# Patient Record
Sex: Female | Born: 2000 | Hispanic: Yes | Marital: Married | Smoking: Never smoker
Health system: Southern US, Community
[De-identification: ages and names within clinical notes are randomized; demographics above are authoritative.]

## PROBLEM LIST (undated history)

## (undated) DIAGNOSIS — Z789 Other specified health status: Secondary | ICD-10-CM

## (undated) HISTORY — PX: NO PAST SURGERIES: SHX2092

## (undated) HISTORY — DX: Other specified health status: Z78.9

---

## 2019-09-09 ENCOUNTER — Ambulatory Visit: Payer: 59 | Attending: Internal Medicine

## 2019-09-09 DIAGNOSIS — Z23 Encounter for immunization: Secondary | ICD-10-CM

## 2019-09-09 NOTE — Progress Notes (Signed)
   Covid-19 Vaccination Clinic  Name:  Meagan Martin    MRN: [id number] DOB: 01/15/2001  09/09/2019  Ms. Meagan Martin was observed post Covid-19 immunization for 15 minutes without incident. She was provided with Vaccine Information Sheet and instruction to access the V-Safe system.   Ms. Meagan Martin was instructed to call 911 with any severe reactions post vaccine: Marland Kitchen Difficulty breathing  . Swelling of face and throat  . A fast heartbeat  . A bad rash all over body  . Dizziness and weakness   Immunizations Administered    Name Date Dose VIS Date Route   Pfizer COVID-19 Vaccine 09/09/2019 12:29 PM 0.3 mL 06/09/2019 Intramuscular   Manufacturer: [employer], [employer]   Lot: [id number]   NDC: [address]-2

## 2019-10-03 ENCOUNTER — Ambulatory Visit: Payer: 59 | Attending: Internal Medicine

## 2019-10-03 DIAGNOSIS — Z23 Encounter for immunization: Secondary | ICD-10-CM

## 2019-10-03 NOTE — Progress Notes (Signed)
   Covid-19 Vaccination Clinic  Name:  Meagan Martin    MRN: [id number] DOB: [birth year]  10/03/2019  Ms. Meagan Martin was observed post Covid-19 immunization for 15 minutes without incident. She was provided with Vaccine Information Sheet and instruction to access the V-Safe system.   Ms. Meagan Martin was instructed to call 911 with any severe reactions post vaccine: Marland Kitchen Difficulty breathing  . Swelling of face and throat  . A fast heartbeat  . A bad rash all over body  . Dizziness and weakness   Immunizations Administered    Name Date Dose VIS Date Route   Pfizer COVID-19 Vaccine 10/03/2019 11:34 AM 0.3 mL 06/09/2019 Intramuscular   Manufacturer: [employer], [employer]   Lot: [id number]   NDC: [address]-0

## 2020-02-27 ENCOUNTER — Other Ambulatory Visit: Payer: Self-pay

## 2020-02-27 ENCOUNTER — Ambulatory Visit (INDEPENDENT_AMBULATORY_CARE_PROVIDER_SITE_OTHER): Payer: 59 | Admitting: Advanced Practice Midwife

## 2020-02-27 ENCOUNTER — Other Ambulatory Visit (HOSPITAL_COMMUNITY)
Admission: RE | Admit: 2020-02-27 | Discharge: 2020-02-27 | Disposition: A | Discharge status: Home / Self Care | Payer: 59 | Source: Ambulatory Visit | Attending: Advanced Practice Midwife | Admitting: Advanced Practice Midwife

## 2020-02-27 ENCOUNTER — Encounter: Payer: Self-pay | Admitting: Advanced Practice Midwife

## 2020-02-27 VITALS — BP 122/84 | HR 82 | Ht 61.0 in | Wt 137.0 lb

## 2020-02-27 DIAGNOSIS — Z3401 Encounter for supervision of normal first pregnancy, first trimester: Secondary | ICD-10-CM

## 2020-02-27 DIAGNOSIS — Z3A08 8 weeks gestation of pregnancy: Secondary | ICD-10-CM

## 2020-02-27 DIAGNOSIS — Z23 Encounter for immunization: Secondary | ICD-10-CM

## 2020-02-27 NOTE — Progress Notes (Signed)
DATING AND VIABILITY SONOGRAM   Meagan Martin is a 19 y.o. year old G1P0 with LMP Patient's last menstrual period was 12/25/2019. which would correlate to  [redacted]w[redacted]d weeks gestation.  She has regular menstrual cycles.   She is here today for a confirmatory initial sonogram.    GESTATION: SINGLETON     FETAL ACTIVITY:          Heart rate       167 bpm          The fetus is active.   GESTATIONAL AGE AND  BIOMETRICS:  Gestational criteria: Estimated Date of Delivery: 10/03/20 by LMP now at [redacted]w[redacted]d  Previous Scans:1      CROWN RUMP LENGTH          1.83 cm       8-2weeks                                                                               AVERAGE EGA(BY THIS SCAN):  8-2 weeks  WORKING EDD( early ultrasound ):  10-03-2020     TECHNICIAN COMMENTS: Patient informed that the ultrasound is considered a limited obstetric ultrasound and is not intended to be a complete ultrasound exam. Patient also informed that the ultrasound is not being completed with the intent of assessing for fetal or placental anomalies or any pelvic abnormalities. Explained that the purpose of today's ultrasound is to assess for fetal heart rate. Patient acknowledges the purpose of the exam and the limitations of the study.      Armandina Stammer 02/27/2020 8:58 AM

## 2020-02-27 NOTE — Progress Notes (Signed)
  Subjective:    Meagan Martin is a G1P0 [redacted]w[redacted]d being seen today for her first obstetrical visit.  Her obstetrical history is significant for Primigravida. Patient does intend to breast feed. Pregnancy history fully reviewed.  Patient reports fatigue.  No nausea.  Vitals:   02/27/20 0837 02/27/20 0838  BP: 122/84   Pulse: 82   Weight: 137 lb (62.1 kg)   Height:  5\' 1"  (1.549 m)    HISTORY: OB History  Gravida Para Term Preterm AB Living  1            SAB TAB Ectopic Multiple Live Births               # Outcome Date GA Lbr Len/2nd Weight Sex Delivery Anes PTL Lv  1 Current            Past Medical History:  Diagnosis Date  . Medical history non-contributory    Past Surgical History:  Procedure Laterality Date  . NO PAST SURGERIES     Family History  Problem Relation Age of Onset  . Diabetes Mother   . Diabetes Father   . Cancer Maternal Grandmother        throat  . Intellectual disability Neg Hx   . Hypertension Neg Hx   . Stroke Neg Hx      Exam    Uterus:     Pelvic Exam:    Perineum: Normal Perineum   Vulva: Bartholin's, Urethra, Skene's normal   Vagina:  normal discharge   pH:    Cervix: no cervical motion tenderness   Adnexa: normal adnexa and no mass, fullness, tenderness   Bony Pelvis: gynecoid  System: Breast:  normal appearance, no masses or tenderness   Skin: normal coloration and turgor, no rashes    Neurologic: oriented, grossly non-focal   Extremities: normal strength, tone, and muscle mass   HEENT neck supple with midline trachea   Mouth/Teeth mucous membranes moist, pharynx normal without lesions   Neck supple and no masses   Cardiovascular: regular rate and rhythm   Respiratory:  appears well, vitals normal, no respiratory distress, acyanotic, normal RR, ear and throat exam is normal, neck free of mass or lymphadenopathy, chest clear, no wheezing, crepitations, rhonchi, normal symmetric air entry   Abdomen: soft, non-tender; bowel  sounds normal; no masses,  no organomegaly   Urinary: urethral meatus normal      Assessment:    Pregnancy: G1P0 Primigravida  Did get Covid Vaccine 10/03/19    Plan:  Initial labs drawn. Continue prenatal vitamins. Discussed and offered genetic screening options, including Quad screen/AFP, NIPS testing, and option to decline testing. Benefits/risks/alternatives reviewed. Pt aware that anatomy 12/03/19 is form of genetic screening with lower accuracy in detecting trisomies than blood work.  Pt chooses genetic screening today. NIPS: ordered. Ultrasound discussed; fetal anatomic survey: ordered. Problem list reviewed and updated. The nature of [facility] with multiple MDs and other Advanced Practice Providers was explained to patient; also emphasized that residents, students are part of our team. Routine obstetric precautions reviewed.   Follow up in 4 weeks. 50% of 30 min visit spent on counseling and coordination of care.   Plans to breastfeed, declines circumcision.  Discussed option for waterbirth.    [facility] 02/27/2020

## 2020-02-27 NOTE — Patient Instructions (Signed)
First Trimester of Pregnancy The first trimester of pregnancy is from week 1 until the end of week 13 (months 1 through 3). A week after a sperm fertilizes an egg, the egg will implant on the wall of the uterus. This embryo will begin to develop into a baby. Genes from you and your partner will form the baby. The female genes will determine whether the baby will be a boy or a girl. At 6-8 weeks, the eyes and face will be formed, and the heartbeat can be seen on ultrasound. At the end of 12 weeks, all the baby's organs will be formed. Now that you are pregnant, you will want to do everything you can to have a healthy baby. Two of the most important things are to get good prenatal care and to follow your health care provider's instructions. Prenatal care is all the medical care you receive before the baby's birth. This care will help prevent, find, and treat any problems during the pregnancy and childbirth. Body changes during your first trimester Your body goes through many changes during pregnancy. The changes vary from woman to woman.  You may gain or lose a couple of pounds at first.  You may feel sick to your stomach (nauseous) and you may throw up (vomit). If the vomiting is uncontrollable, call your health care provider.  You may tire easily.  You may develop headaches that can be relieved by medicines. All medicines should be approved by your health care provider.  You may urinate more often. Painful urination may mean you have a bladder infection.  You may develop heartburn as a result of your pregnancy.  You may develop constipation because certain hormones are causing the muscles that push stool through your intestines to slow down.  You may develop hemorrhoids or swollen veins (varicose veins).  Your breasts may begin to grow larger and become tender. Your nipples may stick out more, and the tissue that surrounds them (areola) may become darker.  Your gums may bleed and may be  sensitive to brushing and flossing.  Dark spots or blotches (chloasma, mask of pregnancy) may develop on your face. This will likely fade after the baby is born.  Your menstrual periods will stop.  You may have a loss of appetite.  You may develop cravings for certain kinds of food.  You may have changes in your emotions from day to day, such as being excited to be pregnant or being concerned that something may go wrong with the pregnancy and baby.  You may have more vivid and strange dreams.  You may have changes in your hair. These can include thickening of your hair, rapid growth, and changes in texture. Some women also have hair loss during or after pregnancy, or hair that feels dry or thin. Your hair will most likely return to normal after your baby is born. What to expect at prenatal visits During a routine prenatal visit:  You will be weighed to make sure you and the baby are growing normally.  Your blood pressure will be taken.  Your abdomen will be measured to track your baby's growth.  The fetal heartbeat will be listened to between weeks 10 and 14 of your pregnancy.  Test results from any previous visits will be discussed. Your health care provider may ask you:  How you are feeling.  If you are feeling the baby move.  If you have had any abnormal symptoms, such as leaking fluid, bleeding, severe headaches, or abdominal   cramping.  If you are using any tobacco products, including cigarettes, chewing tobacco, and electronic cigarettes.  If you have any questions. Other tests that may be performed during your first trimester include:  Blood tests to find your blood type and to check for the presence of any previous infections. The tests will also be used to check for low iron levels (anemia) and protein on red blood cells (Rh antibodies). Depending on your risk factors, or if you previously had diabetes during pregnancy, you may have tests to check for high blood sugar  that affects pregnant women (gestational diabetes).  Urine tests to check for infections, diabetes, or protein in the urine.  An ultrasound to confirm the proper growth and development of the baby.  Fetal screens for spinal cord problems (spina bifida) and Down syndrome.  HIV (human immunodeficiency virus) testing. Routine prenatal testing includes screening for HIV, unless you choose not to have this test.  You may need other tests to make sure you and the baby are doing well. Follow these instructions at home: Medicines  Follow your health care provider's instructions regarding medicine use. Specific medicines may be either safe or unsafe to take during pregnancy.  Take a prenatal vitamin that contains at least 600 micrograms (mcg) of folic acid.  If you develop constipation, try taking a stool softener if your health care provider approves. Eating and drinking   Eat a balanced diet that includes fresh fruits and vegetables, whole grains, good sources of protein such as meat, eggs, or tofu, and low-fat dairy. Your health care provider will help you determine the amount of weight gain that is right for you.  Avoid raw meat and uncooked cheese. These carry germs that can cause birth defects in the baby.  Eating four or five small meals rather than three large meals a day may help relieve nausea and vomiting. If you start to feel nauseous, eating a few soda crackers can be helpful. Drinking liquids between meals, instead of during meals, also seems to help ease nausea and vomiting.  Limit foods that are high in fat and processed sugars, such as fried and sweet foods.  To prevent constipation: ? Eat foods that are high in fiber, such as fresh fruits and vegetables, whole grains, and beans. ? Drink enough fluid to keep your urine clear or pale yellow. Activity  Exercise only as directed by your health care provider. Most women can continue their usual exercise routine during  pregnancy. Try to exercise for 30 minutes at least 5 days a week. Exercising will help you: ? Control your weight. ? Stay in shape. ? Be prepared for labor and delivery.  Experiencing pain or cramping in the lower abdomen or lower back is a good sign that you should stop exercising. Check with your health care provider before continuing with normal exercises.  Try to avoid standing for long periods of time. Move your legs often if you must stand in one place for a long time.  Avoid heavy lifting.  Wear low-heeled shoes and practice good posture.  You may continue to have sex unless your health care provider tells you not to. Relieving pain and discomfort  Wear a good support bra to relieve breast tenderness.  Take warm sitz baths to soothe any pain or discomfort caused by hemorrhoids. Use hemorrhoid cream if your health care provider approves.  Rest with your legs elevated if you have leg cramps or low back pain.  If you develop varicose veins in   your legs, wear support hose. Elevate your feet for 15 minutes, 3-4 times a day. Limit salt in your diet. Prenatal care  Schedule your prenatal visits by the twelfth week of pregnancy. They are usually scheduled monthly at first, then more often in the last 2 months before delivery.  Write down your questions. Take them to your prenatal visits.  Keep all your prenatal visits as told by your health care provider. This is important. Safety  Wear your seat belt at all times when driving.  Make a list of emergency phone numbers, including numbers for family, friends, the hospital, and police and fire departments. General instructions  Ask your health care provider for a referral to a local prenatal education class. Begin classes no later than the beginning of month 6 of your pregnancy.  Ask for help if you have counseling or nutritional needs during pregnancy. Your health care provider can offer advice or refer you to specialists for help  with various needs.  Do not use hot tubs, steam rooms, or saunas.  Do not douche or use tampons or scented sanitary pads.  Do not cross your legs for long periods of time.  Avoid cat litter boxes and soil used by cats. These carry germs that can cause birth defects in the baby and possibly loss of the fetus by miscarriage or stillbirth.  Avoid all smoking, herbs, alcohol, and medicines not prescribed by your health care provider. Chemicals in these products affect the formation and growth of the baby.  Do not use any products that contain nicotine or tobacco, such as cigarettes and e-cigarettes. If you need help quitting, ask your health care provider. You may receive counseling support and other resources to help you quit.  Schedule a dentist appointment. At home, brush your teeth with a soft toothbrush and be gentle when you floss. Contact a health care provider if:  You have dizziness.  You have mild pelvic cramps, pelvic pressure, or nagging pain in the abdominal area.  You have persistent nausea, vomiting, or diarrhea.  You have a bad smelling vaginal discharge.  You have pain when you urinate.  You notice increased swelling in your face, hands, legs, or ankles.  You are exposed to fifth disease or chickenpox.  You are exposed to German measles (rubella) and have never had it. Get help right away if:  You have a fever.  You are leaking fluid from your vagina.  You have spotting or bleeding from your vagina.  You have severe abdominal cramping or pain.  You have rapid weight gain or loss.  You vomit blood or material that looks like coffee grounds.  You develop a severe headache.  You have shortness of breath.  You have any kind of trauma, such as from a fall or a car accident. Summary  The first trimester of pregnancy is from week 1 until the end of week 13 (months 1 through 3).  Your body goes through many changes during pregnancy. The changes vary from  woman to woman.  You will have routine prenatal visits. During those visits, your health care provider will examine you, discuss any test results you may have, and talk with you about how you are feeling. This information is not intended to replace advice given to you by your health care provider. Make sure you discuss any questions you have with your health care provider. Document Revised: 05/28/2017 Document Reviewed: 05/27/2016 Elsevier Patient Education  2020 Else[employer] Inc.  

## 2020-02-28 LAB — CBC/D/PLT+RPR+RH+ABO+RUB AB...
Antibody Screen: NEGATIVE
Basophils Absolute: 0 10*3/uL (ref 0.0–0.2)
Basos: 0 %
EOS (ABSOLUTE): 0 10*3/uL (ref 0.0–0.4)
Eos: 0 %
HCV Ab: <0.1 s/co ratio (ref 0.0–0.9)
HIV Screen 4th Generation wRfx: NONREACTIVE
Hematocrit: 40 % (ref 34.0–46.6)
Hemoglobin: 13.3 g/dL (ref 11.1–15.9)
Hepatitis B Surface Ag: NEGATIVE
Immature Grans (Abs): 0 10*3/uL (ref 0.0–0.1)
Immature Granulocytes: 0 %
Lymphocytes Absolute: 1.8 10*3/uL (ref 0.7–3.1)
Lymphs: 27 %
MCH: 30.5 pg (ref 26.6–33.0)
MCHC: 33.3 g/dL (ref 31.5–35.7)
MCV: 92 fL (ref 79–97)
Monocytes Absolute: 0.5 10*3/uL (ref 0.1–0.9)
Monocytes: 7 %
Neutrophils Absolute: 4.3 10*3/uL (ref 1.4–7.0)
Neutrophils: 66 %
Platelets: 171 10*3/uL (ref 150–450)
RBC: 4.36 x10E6/uL (ref 3.77–5.28)
RDW: 13 % (ref 11.7–15.4)
RPR Ser Ql: NONREACTIVE
Rh Factor: NEGATIVE
Rubella Antibodies, IGG: 3.31 index (ref >0.99)
WBC: 6.7 10*3/uL (ref 3.4–10.8)

## 2020-02-28 LAB — GC/CHLAMYDIA PROBE AMP (~~LOC~~) NOT AT ARMC
Chlamydia: NEGATIVE
Comment: NEGATIVE
Comment: NORMAL
Neisseria Gonorrhea: NEGATIVE

## 2020-02-28 LAB — HEMOGLOBPATHY+FER W/A THAL RFX
Ferritin: 42 ng/mL (ref 15–150)
Hgb A2: 2.6 % (ref 1.8–3.2)
Hgb A: 97.4 % (ref 96.4–98.8)
Hgb F: 0 % (ref 0.0–2.0)
Hgb S: 0 %

## 2020-02-28 LAB — HCV INTERPRETATION

## 2020-02-29 ENCOUNTER — Encounter: Payer: Self-pay | Admitting: Advanced Practice Midwife

## 2020-02-29 DIAGNOSIS — Z3400 Encounter for supervision of normal first pregnancy, unspecified trimester: Secondary | ICD-10-CM | POA: Insufficient documentation

## 2020-03-01 ENCOUNTER — Other Ambulatory Visit: Payer: Self-pay

## 2020-03-01 DIAGNOSIS — O219 Vomiting of pregnancy, unspecified: Secondary | ICD-10-CM

## 2020-03-01 MED ORDER — DOXYLAMINE-PYRIDOXINE 10-10 MG PO TBEC: DELAYED_RELEASE_TABLET | ORAL | 2 refills | Status: DC

## 2020-03-24 ENCOUNTER — Other Ambulatory Visit: Payer: Self-pay

## 2020-03-24 ENCOUNTER — Encounter (HOSPITAL_COMMUNITY): Payer: Self-pay | Admitting: Family Medicine

## 2020-03-24 ENCOUNTER — Inpatient Hospital Stay (HOSPITAL_COMMUNITY)
Admission: AD | Admit: 2020-03-24 | Discharge: 2020-03-24 | Disposition: A | Discharge status: Home / Self Care | Payer: 59 | Attending: Family Medicine | Admitting: Family Medicine

## 2020-03-24 DIAGNOSIS — O360110 Maternal care for anti-D [Rh] antibodies, first trimester, not applicable or unspecified: Secondary | ICD-10-CM | POA: Diagnosis not present

## 2020-03-24 DIAGNOSIS — O2341 Unspecified infection of urinary tract in pregnancy, first trimester: Secondary | ICD-10-CM | POA: Insufficient documentation

## 2020-03-24 DIAGNOSIS — O209 Hemorrhage in early pregnancy, unspecified: Secondary | ICD-10-CM | POA: Insufficient documentation

## 2020-03-24 DIAGNOSIS — O4691 Antepartum hemorrhage, unspecified, first trimester: Secondary | ICD-10-CM | POA: Diagnosis not present

## 2020-03-24 DIAGNOSIS — O26891 Other specified pregnancy related conditions, first trimester: Secondary | ICD-10-CM

## 2020-03-24 DIAGNOSIS — Z3A12 12 weeks gestation of pregnancy: Secondary | ICD-10-CM | POA: Diagnosis not present

## 2020-03-24 DIAGNOSIS — Z6791 Unspecified blood type, Rh negative: Secondary | ICD-10-CM

## 2020-03-24 LAB — URINALYSIS, ROUTINE W REFLEX MICROSCOPIC
Bilirubin Urine: NEGATIVE
Glucose, UA: NEGATIVE mg/dL
Ketones, ur: NEGATIVE mg/dL
Nitrite: NEGATIVE
Protein, ur: 30 mg/dL — AB
RBC / HPF: >50 RBC/hpf — ABNORMAL HIGH (ref 0–5)
Specific Gravity, Urine: 1.013 (ref 1.005–1.030)
WBC, UA: >50 WBC/hpf — ABNORMAL HIGH (ref 0–5)
pH: 5 (ref 5.0–8.0)

## 2020-03-24 LAB — WET PREP, GENITAL
Clue Cells Wet Prep HPF POC: NONE SEEN
Sperm: NONE SEEN
Trich, Wet Prep: NONE SEEN
Yeast Wet Prep HPF POC: NONE SEEN

## 2020-03-24 MED ORDER — CEFADROXIL 500 MG PO CAPS: 500.0000 mg | ORAL_CAPSULE | Freq: Two times a day (BID) | ORAL | 0 refills | Status: AC

## 2020-03-24 MED ORDER — RHO D IMMUNE GLOBULIN 1500 UNIT/2ML IJ SOSY
300.0000 ug | PREFILLED_SYRINGE | Freq: Once | INTRAMUSCULAR | Status: AC
  Administered 2020-03-24: 300 ug via INTRAMUSCULAR
  Filled 2020-03-24: qty 2

## 2020-03-24 NOTE — Discharge Instructions (Signed)
Pregnancy and Urinary Tract Infection  A urinary tract infection (UTI) is an infection of any part of the urinary tract. This includes the kidneys, the tubes that connect your kidneys to your bladder (ureters), the bladder, and the tube that carries urine out of your body (urethra). These organs make, store, and get rid of urine in the body. Your health care provider may use other names to describe the infection. An upper UTI affects the ureters and kidneys (pyelonephritis). A lower UTI affects the bladder (cystitis) and urethra (urethritis). Most urinary tract infections are caused by bacteria in your genital area, around the entrance to your urinary tract (urethra). These bacteria grow and cause irritation and inflammation of your urinary tract. You are more likely to develop a UTI during pregnancy because the physical and hormonal changes your body goes through can make it easier for bacteria to get into your urinary tract. Your growing baby also puts pressure on your bladder and can affect urine flow. It is important to recognize and treat UTIs in pregnancy because of the risk of serious complications for both you and your baby. How does this affect me? Symptoms of a UTI include:  Needing to urinate right away (urgently).  Frequent urination or passing small amounts of urine frequently.  Pain or burning with urination.  Blood in the urine.  Urine that smells bad or unusual.  Trouble urinating.  Cloudy urine.  Pain in the abdomen or lower back.  Vaginal discharge. You may also have:  Vomiting or a decreased appetite.  Confusion.  Irritability or tiredness.  A fever.  Diarrhea. How does this affect my baby? An untreated UTI during pregnancy could lead to a kidney infection or a systemic infection, which can cause health problems that could affect your baby. Possible complications of an untreated UTI include:  Giving birth to your baby before 37 weeks of pregnancy  (premature).  Having a baby with a low birth weight.  Developing high blood pressure during pregnancy (preeclampsia).  Having a low hemoglobin level (anemia). What can I do to lower my risk? To prevent a UTI:  Go to the bathroom as soon as you feel the need. Do not hold urine for long periods of time.  Always wipe from front to back, especially after a bowel movement. Use each tissue one time when you wipe.  Empty your bladder after sex.  Keep your genital area dry.  Drink 6-10 glasses of water each day.  Do not douche or use deodorant sprays. How is this treated? Treatment for this condition may include:  Antibiotic medicines that are safe to take during pregnancy.  Other medicines to treat less common causes of UTI. Follow these instructions at home:  If you were prescribed an antibiotic medicine, take it as told by your health care provider. Do not stop using the antibiotic even if you start to feel better.  Keep all follow-up visits as told by your health care provider. This is important. Contact a health care provider if:  Your symptoms do not improve or they get worse.  You have abnormal vaginal discharge. Get help right away if you:  Have a fever.  Have nausea and vomiting.  Have back or side pain.  Feel contractions in your uterus.  Have lower belly pain.  Have a gush of fluid from your vagina.  Have blood in your urine. Summary  A urinary tract infection (UTI) is an infection of any part of the urinary tract, which includes the   kidneys, ureters, bladder, and urethra.  Most urinary tract infections are caused by bacteria in your genital area, around the entrance to your urinary tract (urethra).  You are more likely to develop a UTI during pregnancy.  If you were prescribed an antibiotic medicine, take it as told by your health care provider. Do not stop using the antibiotic even if you start to feel better. This information is not intended to  replace advice given to you by your health care provider. Make sure you discuss any questions you have with your health care provider. Document Revised: 10/07/2018 Document Reviewed: 05/19/2018 Elsevier Patient Education  2020 [employer] Inc.     Vaginal Bleeding During Pregnancy, First Trimester  A small amount of bleeding from the vagina (spotting) is relatively common during early pregnancy. It usually stops on its own. Various things may cause bleeding or spotting during early pregnancy. Some bleeding may be related to the pregnancy, and some may not. In many cases, the bleeding is normal and is not a problem. However, bleeding can also be a sign of something serious. Be sure to tell your health care provider about any vaginal bleeding right away. Some possible causes of vaginal bleeding during the first trimester include:  Infection or inflammation of the cervix.  Growths (polyps) on the cervix.  Miscarriage or threatened miscarriage.  Pregnancy tissue developing outside of the uterus (ectopic pregnancy).  A mass of tissue developing in the uterus due to an egg being fertilized incorrectly (molar pregnancy). Follow these instructions at home: Activity  Follow instructions from your health care provider about limiting your activity. Ask what activities are safe for you.  If needed, make plans for someone to help with your regular activities.  Do not have sex or orgasms until your health care provider says that this is safe. General instructions  Take over-the-counter and prescription medicines only as told by your health care provider.  Pay attention to any changes in your symptoms.  Do not use tampons or douche.  Write down how many pads you use each day, how often you change pads, and how soaked (saturated) they are.  If you pass any tissue from your vagina, save the tissue so you can show it to your health care provider.  Keep all follow-up visits as told by your health  care provider. This is important. Contact a health care provider if:  You have vaginal bleeding during any part of your pregnancy.  You have cramps or labor pains.  You have a fever. Get help right away if:  You have severe cramps in your back or abdomen.  You pass large clots or a large amount of tissue from your vagina.  Your bleeding increases.  You feel light-headed or weak, or you faint.  You have chills.  You are leaking fluid or have a gush of fluid from your vagina. Summary  A small amount of bleeding (spotting) from the vagina is relatively common during early pregnancy.  Various things may cause bleeding or spotting in early pregnancy.  Be sure to tell your health care provider about any vaginal bleeding right away. This information is not intended to replace advice given to you by your health care provider. Make sure you discuss any questions you have with your health care provider. Document Revised: 10/04/2018 Document Reviewed: 09/17/2016 Elsevier Patient Education  2020 [employer].

## 2020-03-24 NOTE — MAU Note (Signed)
Meagan Martin is a 19 y.o. at [redacted]w[redacted]d here in MAU reporting: since Thursday has been having intermittent spotting and then today when using the bathroom she saw a lot more bleeding. Is wearing a pad today but has not had to change it. Having some lower abdominal pain.  Onset of complaint: ongoing but worse today  Pain score: 4/10  Vitals:   03/24/20 1404  BP: 118/76  Pulse: 89  Resp: 16  Temp: 98.5 F (36.9 C)  SpO2: 98%     Lab orders placed from triage: UA

## 2020-03-24 NOTE — MAU Provider Note (Signed)
History     CSN: [id number]  Arrival date and time: 03/24/20 1339   First Provider Initiated Contact with Patient 03/24/20 1501      Chief Complaint  Patient presents with  . Abdominal Pain  . Vaginal Bleeding   Meagan Martin is a 19 y.o. G1P0 at [redacted]w[redacted]d who presents to MAU complaining of vaginal bleeding & abdominal cramping. Reports intermittent lower abdominal cramping since Thursday. Also has had some dysuria today at the end of her stream. No hematuria, n/v, fever/chills, or flank pain. Has seen pink/red/brown spotting on toilet paper. Not having to wear a pad and not passing blood clots. Last had intercourse 2 days ago. No abnormal vaginal discharge.  Rates abdominal cramping 4/10. Hasn't treated symptoms. Nothing makes better or worse.    OB History    Gravida  1   Para      Term      Preterm      AB      Living        SAB      TAB      Ectopic      Multiple      Live Births              Past Medical History:  Diagnosis Date  . Medical history non-contributory     Past Surgical History:  Procedure Laterality Date  . NO PAST SURGERIES      Family History  Problem Relation Age of Onset  . Diabetes Mother   . Diabetes Father   . Cancer Maternal Grandmother        throat  . Intellectual disability Neg Hx   . Hypertension Neg Hx   . Stroke Neg Hx     Social History   Tobacco Use  . Smoking status: Never Smoker  . Smokeless tobacco: Never Used  Vaping Use  . Vaping Use: Never used  Substance Use Topics  . Alcohol use: Never  . Drug use: Never    Allergies: No Known Allergies  Medications Prior to Admission  Medication Sig Dispense Refill Last Dose  . Doxylamine-Pyridoxine (DICLEGIS) 10-10 MG TBEC Take 2 tablets at bedtime. If symptoms persist add 1 tablet in the morning and 1 tablet in the afternoon. 60 tablet 2     Review of Systems  Constitutional: Negative.   Gastrointestinal: Positive for abdominal pain. Negative for  constipation, diarrhea, nausea and vomiting.  Genitourinary: Positive for dysuria and vaginal bleeding. Negative for flank pain, frequency, hematuria, urgency and vaginal discharge.   Physical Exam   Blood pressure 118/76, pulse 89, temperature 98.5 F (36.9 C), temperature source Oral, resp. rate 16, last menstrual period 12/25/2019, SpO2 98 %.  Physical Exam Vitals and nursing note reviewed. Exam conducted with a chaperone present.  Constitutional:      General: She is not in acute distress.    Appearance: She is well-developed and normal weight.  Pulmonary:     Effort: Pulmonary effort is normal. No respiratory distress.  Abdominal:     Palpations: Abdomen is soft.     Tenderness: There is no abdominal tenderness.  Genitourinary:    Exam position: Lithotomy position.     Labia:        Right: No lesion.        Left: No lesion.      Vagina: Normal.     Cervix: Friability present. No cervical motion tenderness, discharge or cervical bleeding.     Comments: Cervix closed Skin:  General: Skin is warm and dry.  Neurological:     Mental Status: She is alert.  Psychiatric:        Mood and Affect: Mood normal.        Behavior: Behavior normal.     MAU Course  Procedures Results for orders placed or performed during the hospital encounter of 03/24/20 (from the past 24 hour(s))  Urinalysis, Routine w reflex microscopic Urine, Clean Catch     Status: Abnormal   Collection Time: 03/24/20  2:24 PM  Result Value Ref Range   Color, Urine YELLOW YELLOW   APPearance HAZY (A) CLEAR   Specific Gravity, Urine 1.013 1.005 - 1.030   pH 5.0 5.0 - 8.0   Glucose, UA NEGATIVE NEGATIVE mg/dL   Hgb urine dipstick LARGE (A) NEGATIVE   Bilirubin Urine NEGATIVE NEGATIVE   Ketones, ur NEGATIVE NEGATIVE mg/dL   Protein, ur 30 (A) NEGATIVE mg/dL   Nitrite NEGATIVE NEGATIVE   Leukocytes,Ua MODERATE (A) NEGATIVE   RBC / HPF >50 (H) 0 - 5 RBC/hpf   WBC, UA >50 (H) 0 - 5 WBC/hpf   Bacteria, UA  MANY (A) NONE SEEN   Squamous Epithelial / LPF 0-5 0 - 5   Mucus PRESENT   Wet prep, genital     Status: Abnormal   Collection Time: 03/24/20  4:02 PM   Specimen: Cervix  Result Value Ref Range   Yeast Wet Prep HPF POC NONE SEEN NONE SEEN   Trich, Wet Prep NONE SEEN NONE SEEN   Clue Cells Wet Prep HPF POC NONE SEEN NONE SEEN   WBC, Wet Prep HPF POC MANY (A) NONE SEEN   Sperm NONE SEEN   Rh IG workup (includes ABO/Rh)     Status: None (Preliminary result)   Collection Time: 03/24/20  4:08 PM  Result Value Ref Range   Gestational Age(Wks) 12    ABO/RH(D) O NEG    Antibody Screen NEG    Unit Number [id number]    Blood Component Type RHIG    Unit division 00    Status of Unit ISSUED    Transfusion Status      OK TO TRANSFUSE Performed at [facility], [address] [place], [place] [address]     MDM FHT present via doppler  No active bleeding on exam but some friability near os. Cervix is closed. Patient is Rh negative, will given rhogam today.   Wet prep & GC/CT collected  U/a with moderate leuks & bacteria. Will tx for UTI & send urine for culture. No s/s of pyelo at this time.   Assessment and Plan   1. Urinary tract infection in mother during first trimester of pregnancy  -Rx cefdinir -urine culture pending -pyelo precautions  2. Rh negative status during pregnancy in first trimester  -received rhogam today  3. [redacted] weeks gestation of pregnancy   4. Vaginal bleeding in pregnancy, first trimester  -reviewed bleeding precautions -GC/CT pending    Judeth Horn 03/24/2020, 3:01 PM

## 2020-03-25 LAB — RH IG WORKUP (INCLUDES ABO/RH)
ABO/RH(D): O NEG
Antibody Screen: NEGATIVE
Gestational Age(Wks): 12
Unit division: 0

## 2020-03-25 LAB — GC/CHLAMYDIA PROBE AMP (~~LOC~~) NOT AT ARMC
Chlamydia: NEGATIVE
Comment: NEGATIVE
Comment: NORMAL
Neisseria Gonorrhea: NEGATIVE

## 2020-03-26 ENCOUNTER — Encounter: Payer: Self-pay | Admitting: Advanced Practice Midwife

## 2020-03-26 ENCOUNTER — Other Ambulatory Visit: Payer: Self-pay

## 2020-03-26 ENCOUNTER — Ambulatory Visit (INDEPENDENT_AMBULATORY_CARE_PROVIDER_SITE_OTHER): Payer: 59 | Admitting: Advanced Practice Midwife

## 2020-03-26 VITALS — BP 130/76 | HR 83 | Wt 133.0 lb

## 2020-03-26 DIAGNOSIS — O209 Hemorrhage in early pregnancy, unspecified: Secondary | ICD-10-CM

## 2020-03-26 DIAGNOSIS — Z3A12 12 weeks gestation of pregnancy: Secondary | ICD-10-CM

## 2020-03-26 DIAGNOSIS — Z23 Encounter for immunization: Secondary | ICD-10-CM | POA: Diagnosis not present

## 2020-03-26 DIAGNOSIS — Z3401 Encounter for supervision of normal first pregnancy, first trimester: Secondary | ICD-10-CM

## 2020-03-26 LAB — CULTURE, OB URINE: Culture: >=100000 — AB

## 2020-03-26 NOTE — Patient Instructions (Signed)
Genetic Testing During Pregnancy Genetic testing during pregnancy is also called prenatal genetic testing. This type of testing can determine if your baby is at risk of being born with a disorder caused by abnormal genes or chromosomes (genetic disorder). Chromosomes contain genes that control how your baby will develop in your womb. There are many different genetic disorders. Examples of genetic disorders that may be found through genetic testing include Down syndrome and cystic fibrosis. Gene changes (mutations) can be passed down through families. Genetic testing is offered to all women before or during pregnancy. You can choose whether to have genetic testing. Why is genetic testing done? Genetic testing is done during pregnancy to find out whether your child is at risk for a genetic disorder. Having genetic testing allows you to:  Discuss your test results and options with a genetic counselor.  Prepare for a baby that may be born with a genetic disorder. Learning about the disorder ahead of time helps you be better prepared to manage it. Your health care providers can also be prepared in case your baby requires special care before or after birth.  Consider whether you want to continue with the pregnancy. In some cases, genetic testing may be done to learn about the traits a child will inherit. Types of genetic tests There are two basic types of genetic testing. Screening tests indicate whether your developing baby (fetus) is at higher risk for a genetic disorder. Diagnostic tests check actual fetal cells to diagnose a genetic disorder. Screening tests     Screening tests will not harm your baby. They are recommended for all pregnant women. Types of screening tests include:  Carrier screening. This test involves checking genes from both parents by testing their blood or saliva. The test checks to find out if the parents carry a genetic mutation that may be passed to a baby. In most cases,  both parents must carry the mutation for a baby to be at risk.  First trimester screening. This test combines a blood test with sound wave imaging of your baby (fetal ultrasound). This screening test checks for a risk of Down syndrome or other defects caused by having extra chromosomes. It also checks for defects of the heart, abdomen, or skeleton.  Second trimester screening also combines a blood test with a fetal ultrasound exam. It checks for a risk of genetic defects of the face, brain, spine, heart, or limbs.  Combined or sequential screening. This type of testing combines the results of first and second trimester screening. This type of testing may be more accurate than first or second trimester screening alone.  Cell-free DNA testing. This is a blood test that detects cells released by the placenta that get into the mother's blood. It can be used to check for a risk of Down syndrome, other extra chromosome syndromes, and disorders caused by abnormal numbers of sex chromosomes. This test can be done any time after 10 weeks of pregnancy.  Diagnostic tests Diagnostic tests carry slight risks of problems, including bleeding, infection, and loss of the pregnancy. These tests are done only if your baby is at risk for a genetic disorder. You may meet with a genetic counselor to discuss the risks and benefits before having diagnostic tests. Examples of diagnostic tests include:  Chorionic villus sampling (CVS). This involves a procedure to remove and test a sample of cells taken from the placenta. The procedure may be done between 10 and 12 weeks of pregnancy.  Amniocentesis. This involves a   procedure to remove and test a sample of fluid (amniotic fluid) and cells from the sac that surrounds the developing baby. The procedure may be done between 15 and 20 weeks of pregnancy. What do the results mean? For a screening test:  If the results are negative, it often means that your child is not at higher  risk. There is still a slight chance your child could have a genetic disorder.  If the results are positive, it does not mean your child will have a genetic disorder. It may mean that your child has a higher-than-normal risk for a genetic disorder. In that case, you may want to talk with a genetic counselor about whether you should have diagnostic genetic tests. For a diagnostic test:  If the result is negative, it is unlikely that your child will have a genetic disorder.  If the test is positive for a genetic disorder, it is likely that your child will have the disorder. The test may not tell how severe the disorder will be. Talk with your health care provider about your options. Questions to ask your health care provider Before talking to your health care provider about genetic testing, find out if there is a history of genetic disorders in your family. It may also help to know your family's ethnic origins. Then ask your health care provider the following questions:  Is my baby at risk for a genetic disorder?  What are the benefits of having genetic screening?  What tests are best for me and my baby?  What are the risks of each test?  If I get a positive result on a screening test, what is the next step?  Should I meet with a genetic counselor before having a diagnostic test?  Should my partner or other members of my family be tested?  How much do the tests cost? Will my insurance cover the testing? Summary  Genetic testing is done during pregnancy to find out whether your child is at risk for a genetic disorder.  Genetic testing is offered to all women before or during pregnancy. You can choose whether to have genetic testing.  There are two basic types of genetic testing. Screening tests indicate whether your developing baby (fetus) is at higher risk for a genetic disorder. Diagnostic tests check actual fetal cells to diagnose a genetic disorder.  If a diagnostic genetic test is  positive, talk with your health care provider about your options. This information is not intended to replace advice given to you by your health care provider. Make sure you discuss any questions you have with your health care provider. Document Revised: 10/06/2018 Document Reviewed: 08/30/2017 Elsevier Patient Education  2020 Elsevier Inc.  

## 2020-03-26 NOTE — Progress Notes (Signed)
   PRENATAL VISIT NOTE  Subjective:  Meagan Martin is a 19 y.o. G1P0 at [redacted]w[redacted]d being seen today for ongoing prenatal care.  She is currently monitored for the following issues for this low-risk pregnancy and has High priority for 2019-nCoV vaccine and Supervision of normal first pregnancy, antepartum on their problem list.  Patient reports bleeding. Seen in MAU 2 days ago,  Viable fetus with minimal bleeding.  Contractions: Not present. Vag. Bleeding: Scant, Small.   . Denies leaking of fluid.   The following portions of the patient's history were reviewed and updated as appropriate: allergies, current medications, past family history, past medical history, past social history, past surgical history and problem list.   Objective:   Vitals:   03/26/20 0856  BP: 130/76  Pulse: 83  Weight: 133 lb (60.3 kg)    Fetal Status: Fetal Heart Rate (bpm): 151 Fundal Height: 12 cm       General:  Alert, oriented and cooperative. Patient is in no acute distress.  Skin: Skin is warm and dry. No rash noted.   Cardiovascular: Normal heart rate noted  Respiratory: Normal respiratory effort, no problems with respiration noted  Abdomen: Soft, gravid, appropriate for gestational age.  Pain/Pressure: Absent     Pelvic: Cervical exam deferred        Extremities: Normal range of motion.  Edema: None  Mental Status: Normal mood and affect. Normal behavior. Normal judgment and thought content.   US done by RN to verify fetal viability No SCH seen  Nausea better, fatigue better  Assessment and Plan:  Pregnancy: G1P0 at [redacted]w[redacted]d 1. Encounter for supervision of normal first pregnancy in first trimester      Panorama today,      AFP next month - [place] [facility] OB COMP + 14 WK; Future  2. [redacted] weeks gestation of pregnancy    Flu shot given - [place] [facility] OB COMP + 14 WK; Future  Preterm labor symptoms and general obstetric precautions including but not limited to vaginal bleeding, contractions, leaking of fluid  and fetal movement were reviewed in detail with the patient. Please refer to After Visit Summary for other counseling recommendations.   Return in about 4 weeks (around 04/23/2020) for [employer].   Wynelle Bourgeois, CNM

## 2020-03-26 NOTE — Addendum Note (Signed)
Addended by: Anell Barr on: 03/26/2020 09:28 AM   Modules accepted: Orders

## 2020-03-26 NOTE — Progress Notes (Signed)
Patient was seen in MAU for bleeding and states she also had a UTI. Patient is still having some spotting now. Armandina Stammer RN

## 2020-04-23 ENCOUNTER — Other Ambulatory Visit: Payer: Self-pay | Admitting: Advanced Practice Midwife

## 2020-04-23 ENCOUNTER — Encounter: Payer: 59 | Admitting: Advanced Practice Midwife

## 2020-04-23 DIAGNOSIS — Z8669 Personal history of other diseases of the nervous system and sense organs: Secondary | ICD-10-CM

## 2020-04-23 MED ORDER — BUTALBITAL-APAP-CAFFEINE 50-325-40 MG PO CAPS: 1.0000 | ORAL_CAPSULE | Freq: Four times a day (QID) | ORAL | 0 refills | Status: DC | PRN

## 2020-04-23 NOTE — Progress Notes (Signed)
C/o migraines Not responsive to Tylenol  Will try Fioricet  If not helpful, refere to [facility] specialist

## 2020-04-23 NOTE — Progress Notes (Signed)
First Rx printed, so discontinued and reordered to go to pharmacy

## 2020-04-25 ENCOUNTER — Other Ambulatory Visit: Payer: Self-pay

## 2020-04-25 ENCOUNTER — Ambulatory Visit (INDEPENDENT_AMBULATORY_CARE_PROVIDER_SITE_OTHER): Payer: 59 | Admitting: Family Medicine

## 2020-04-25 VITALS — BP 111/66 | HR 80 | Wt 137.0 lb

## 2020-04-25 DIAGNOSIS — Z3401 Encounter for supervision of normal first pregnancy, first trimester: Secondary | ICD-10-CM

## 2020-04-25 DIAGNOSIS — Z3A17 17 weeks gestation of pregnancy: Secondary | ICD-10-CM

## 2020-04-25 DIAGNOSIS — O26891 Other specified pregnancy related conditions, first trimester: Secondary | ICD-10-CM

## 2020-04-25 DIAGNOSIS — Z6791 Unspecified blood type, Rh negative: Secondary | ICD-10-CM

## 2020-04-25 NOTE — Progress Notes (Signed)
   PRENATAL VISIT NOTE  Subjective:  Meagan Martin is a 19 y.o. G1P0 at [redacted]w[redacted]d being seen today for ongoing prenatal care.  She is currently monitored for the following issues for this low-risk pregnancy and has High priority for 2019-nCoV vaccine and Supervision of normal first pregnancy, antepartum on their problem list.  Patient reports no complaints.  Contractions: Not present. Vag. Bleeding: None.  Movement: Absent. Denies leaking of fluid.   The following portions of the patient's history were reviewed and updated as appropriate: allergies, current medications, past family history, past medical history, past social history, past surgical history and problem list.   Objective:   Vitals:   04/25/20 0844  BP: 111/66  Pulse: 80  Weight: 137 lb (62.1 kg)    Fetal Status: Fetal Heart Rate (bpm): 145 Fundal Height: 17 cm Movement: Absent     General:  Alert, oriented and cooperative. Patient is in no acute distress.  Skin: Skin is warm and dry. No rash noted.   Cardiovascular: Normal heart rate noted  Respiratory: Normal respiratory effort, no problems with respiration noted  Abdomen: Soft, gravid, appropriate for gestational age.  Pain/Pressure: Absent     Pelvic: Cervical exam deferred        Extremities: Normal range of motion.  Edema: None  Mental Status: Normal mood and affect. Normal behavior. Normal judgment and thought content.   Assessment and Plan:  Pregnancy: G1P0 at [redacted]w[redacted]d 1. [redacted] weeks gestation of pregnancy  2. Encounter for supervision of normal first pregnancy in first trimester FHT and FH normal  3. Rh negative status during pregnancy in first trimester Rhogam at 28 weeks  Preterm labor symptoms and general obstetric precautions including but not limited to vaginal bleeding, contractions, leaking of fluid and fetal movement were reviewed in detail with the patient. Please refer to After Visit Summary for other counseling recommendations.   Return in  about 4 weeks (around 05/23/2020).  Future Appointments  Date Time Provider Department Center  05/09/2020  2:45 PM [facility] US[facility]  05/22/2020  9:00 AM Reva Bores, MD [facility] None    Levie Heritage, DO

## 2020-05-09 ENCOUNTER — Ambulatory Visit: Payer: 59 | Attending: Advanced Practice Midwife

## 2020-05-09 ENCOUNTER — Other Ambulatory Visit: Payer: Self-pay

## 2020-05-09 DIAGNOSIS — Z3A12 12 weeks gestation of pregnancy: Secondary | ICD-10-CM | POA: Insufficient documentation

## 2020-05-09 DIAGNOSIS — Z3401 Encounter for supervision of normal first pregnancy, first trimester: Secondary | ICD-10-CM | POA: Insufficient documentation

## 2020-05-10 ENCOUNTER — Other Ambulatory Visit: Payer: Self-pay | Admitting: *Deleted

## 2020-05-10 DIAGNOSIS — Z362 Encounter for other antenatal screening follow-up: Secondary | ICD-10-CM

## 2020-05-22 ENCOUNTER — Ambulatory Visit (INDEPENDENT_AMBULATORY_CARE_PROVIDER_SITE_OTHER): Payer: 59 | Admitting: Family Medicine

## 2020-05-22 ENCOUNTER — Other Ambulatory Visit: Payer: Self-pay

## 2020-05-22 VITALS — BP 105/69 | HR 81 | Wt 139.0 lb

## 2020-05-22 DIAGNOSIS — Z3400 Encounter for supervision of normal first pregnancy, unspecified trimester: Secondary | ICD-10-CM

## 2020-05-22 DIAGNOSIS — Z3A20 20 weeks gestation of pregnancy: Secondary | ICD-10-CM

## 2020-05-22 NOTE — Patient Instructions (Signed)

## 2020-05-22 NOTE — Progress Notes (Signed)
° °  PRENATAL VISIT NOTE  Subjective:  Meagan Martin is a 19 y.o. G1P0 at [redacted]w[redacted]d being seen today for ongoing prenatal care.  She is currently monitored for the following issues for this low-risk pregnancy and has Supervision of normal first pregnancy, antepartum on their problem list.  Patient reports no complaints.  Contractions: Not present. Vag. Bleeding: None.  Movement: Present. Denies leaking of fluid.   The following portions of the patient's history were reviewed and updated as appropriate: allergies, current medications, past family history, past medical history, past social history, past surgical history and problem list.   Objective:   Vitals:   05/22/20 0926  BP: 105/69  Pulse: 81  Weight: 139 lb (63 kg)    Fetal Status: Fetal Heart Rate (bpm): 139   Movement: Present     General:  Alert, oriented and cooperative. Patient is in no acute distress.  Skin: Skin is warm and dry. No rash noted.   Cardiovascular: Normal heart rate noted  Respiratory: Normal respiratory effort, no problems with respiration noted  Abdomen: Soft, gravid, appropriate for gestational age.  Pain/Pressure: Present     Pelvic: Cervical exam deferred        Extremities: Normal range of motion.  Edema: None  Mental Status: Normal mood and affect. Normal behavior. Normal judgment and thought content.   Assessment and Plan:  Pregnancy: G1P0 at [redacted]w[redacted]d 1. [redacted] weeks gestation of pregnancy  - AFP, Serum, Open Spina Bifida  2. Supervision of normal first pregnancy, antepartum Continue routine prenatal care. - AFP, Serum, Open Spina Bifida  Preterm labor symptoms and general obstetric precautions including but not limited to vaginal bleeding, contractions, leaking of fluid and fetal movement were reviewed in detail with the patient. Please refer to After Visit Summary for other counseling recommendations.   Return in 4 weeks (on 06/19/2020) for [facility], virtual.  Future Appointments  Date Time  Provider Department Center  06/07/2020  8:30 AM [facility] NURSE [facility]  06/07/2020  8:45 AM [facility] US4 [facility]  06/19/2020  9:00 AM Jerene Bears, MD [facility] None    Reva Bores, MD

## 2020-05-25 LAB — AFP, SERUM, OPEN SPINA BIFIDA
AFP MoM: 1.19
AFP Value: 77.7 ng/mL
Gest. Age on Collection Date: 20.6 weeks
Maternal Age At EDD: 21.4 yr
OSBR Risk 1 IN: 6704
Test Results:: NEGATIVE
Weight: 139 [lb_av]

## 2020-06-07 ENCOUNTER — Encounter: Payer: Self-pay | Admitting: *Deleted

## 2020-06-07 ENCOUNTER — Other Ambulatory Visit: Payer: Self-pay

## 2020-06-07 ENCOUNTER — Ambulatory Visit: Payer: 59 | Attending: Obstetrics and Gynecology

## 2020-06-07 ENCOUNTER — Ambulatory Visit: Payer: 59 | Admitting: *Deleted

## 2020-06-07 VITALS — BP 107/59 | HR 86

## 2020-06-07 DIAGNOSIS — Z362 Encounter for other antenatal screening follow-up: Secondary | ICD-10-CM | POA: Diagnosis not present

## 2020-06-07 DIAGNOSIS — O360190 Maternal care for anti-D [Rh] antibodies, unspecified trimester, not applicable or unspecified: Secondary | ICD-10-CM

## 2020-06-07 DIAGNOSIS — Z3A23 23 weeks gestation of pregnancy: Secondary | ICD-10-CM

## 2020-06-07 DIAGNOSIS — Z09 Encounter for follow-up examination after completed treatment for conditions other than malignant neoplasm: Secondary | ICD-10-CM

## 2020-06-19 ENCOUNTER — Telehealth (INDEPENDENT_AMBULATORY_CARE_PROVIDER_SITE_OTHER): Payer: 59 | Admitting: Obstetrics & Gynecology

## 2020-06-19 VITALS — BP 112/78

## 2020-06-19 DIAGNOSIS — Z3400 Encounter for supervision of normal first pregnancy, unspecified trimester: Secondary | ICD-10-CM

## 2020-06-19 DIAGNOSIS — Z6791 Unspecified blood type, Rh negative: Secondary | ICD-10-CM

## 2020-06-19 DIAGNOSIS — M5430 Sciatica, unspecified side: Secondary | ICD-10-CM

## 2020-06-19 DIAGNOSIS — O360120 Maternal care for anti-D [Rh] antibodies, second trimester, not applicable or unspecified: Secondary | ICD-10-CM

## 2020-06-19 DIAGNOSIS — Z3A24 24 weeks gestation of pregnancy: Secondary | ICD-10-CM

## 2020-06-19 DIAGNOSIS — O26892 Other specified pregnancy related conditions, second trimester: Secondary | ICD-10-CM

## 2020-06-19 NOTE — Progress Notes (Signed)
   PRENATAL VISIT NOTE--Virtual Visit via Video Note  I connected with Meagan Martin on 06/20/20 at  9:00 AM EST by a video enabled telemedicine application and verified that I am speaking with the correct person using two identifiers.  Location: Patient: home Provider: [facility] office   I discussed the limitations of evaluation and management by telemedicine and the availability of in person appointments. The patient expressed understanding and agreed to proceed.  Subjective:  Meagan Martin is a 18 y.o. G1P0 at [redacted]w[redacted]d being seen today for ongoing prenatal care.  She is currently monitored for the following issues for this low-risk pregnancy and has Supervision of normal first pregnancy, antepartum on their problem list.   BP today 112/78.  She is not checking these but advised to check if having virtual visits.  She is a nursing students so knows how.   She is having right sided pain from low back across/down her butt.  It hurts the most with standing for long periods of time or sometimes with lifting right leg.  Not taking anything for this.  Patient reports no complaints.  Contractions: Not present. Vag. Bleeding: None.  Movement: Present. Denies leaking of fluid.   The following portions of the patient's history were reviewed and updated as appropriate: allergies, current medications, past family history, past medical history, past social history, past surgical history and problem list.   Objective:   Vitals:   06/19/20 0901  BP: 112/78    Fetal Status:     Movement: Present     General:  Alert, oriented and cooperative. Patient is in no acute distress.  Skin: Skin is warm and dry. No rash noted.   Cardiovascular: Normal heart rate noted  Respiratory: Normal respiratory effort, no problems with respiration noted  Abdomen: Soft, gravid, appropriate for gestational age.  Pain/Pressure: Absent     Pelvic: Cervical exam deferred        Extremities: Normal  range of motion.  Edema: None  Mental Status: Normal mood and affect. Normal behavior. Normal judgment and thought content.   Assessment and Plan:  Pregnancy: G1P0 at [redacted]w[redacted]d 1. [redacted] weeks gestation of pregnancy - Return 4 weeks.  Will need 28 week labs, 2 hr GTT.  D/w pt Tdap.  She feels certain she's had this during the last year, possibly summer, so will try to find this documentation.  Last Tdap in Epic was 10/16/2014.  2. Supervision of normal first pregnancy, antepartum  3. Sciatica - pt is going to return to office sooner for appt with Dr. Carron Brazen for adjustment/assesment of back issues.  4.  Rh negative during second trimester - reviewed antibody testing and rhogam at 28 weeks as well.  Preterm labor symptoms and general obstetric precautions including but not limited to vaginal bleeding, contractions, leaking of fluid and fetal movement were reviewed in detail with the patient. Please refer to After Visit Summary for other counseling recommendations.   Return in about 4 weeks (around 07/17/2020).  Future Appointments  Date Time Provider Department Center  07/05/2020 10:30 AM Levie Heritage, DO [facility] None  07/16/2020 10:05 AM Mayford Knife Elby Showers, CNM [facility] None    Jerene Bears, MD

## 2020-06-20 ENCOUNTER — Encounter: Payer: Self-pay | Admitting: Obstetrics & Gynecology

## 2020-06-29 NOTE — L&D Delivery Note (Addendum)
OB/GYN Faculty Practice Delivery Note  Meagan Martin is a 20 y.o. G1P0 s/p Vaginal Delivery at [redacted]w[redacted]d. She was admitted for eIOL; found to have new diagnosis of preeclampsia without severe features on admission.  ROM: 15h 58m with clear fluid, terminal meconium GBS Status: Negative Maximum Maternal Temperature: 98.69F  Labor Progress: On admission pt received cytotec x2 and FB was placed. Pt was then started on pitocin at 1900 on 3/31. SROM for clear fluid at 0155 on 4/1. She then progressed to complete cervical dilation and had an uncomplicated delivery as noted below.  Delivery Date/Time: 09/27/20 at 1706 Delivery: Called to room and patient was complete and pushing. Head delivered direct occiput posterior. No nuchal cord present. Shoulder and body delivered in usual fashion. Infant with spontaneous cry, placed on mother's abdomen, dried and stimulated. Cord clamped x 2 after 1-minute delay, and cut by FOB under my direct supervision. Cord blood drawn. Placenta delivered spontaneously with gentle cord traction. Fundus firm with massage and Pitocin. Labia, perineum, vagina, and cervix were inspected, notable for 2nd degree perineal laceration and bilateral peri-urethral lacerations. Repaired 2nd degree laceration with use of 3-0 vicryl and right peri-urethral laceration with 4-0 vicryl. Left peri-urethral laceration was hemostatic.  Placenta: Intact, 3 vessel cord, Sent to L&D Complications: None Lacerations: 2nd degree Laceration, bilateral peri-urethral EBL: 175 mL Analgesia: Epidural  Infant: Viable Female Apgars: 8 & 9 at 1 min and 5 min  Fetal weight: per medical record  Meagan Martin, PGY1  I was present and gloved for delivery as noted above. I performed laceration repairs as noted above.  Meagan Shields, MD OB/GYN Fellow, Faculty Practice

## 2020-07-05 ENCOUNTER — Ambulatory Visit (INDEPENDENT_AMBULATORY_CARE_PROVIDER_SITE_OTHER): Payer: 59 | Admitting: Family Medicine

## 2020-07-05 ENCOUNTER — Other Ambulatory Visit: Payer: Self-pay

## 2020-07-05 VITALS — BP 106/66 | HR 95 | Wt 142.0 lb

## 2020-07-05 DIAGNOSIS — Z3A27 27 weeks gestation of pregnancy: Secondary | ICD-10-CM

## 2020-07-05 DIAGNOSIS — M9904 Segmental and somatic dysfunction of sacral region: Secondary | ICD-10-CM

## 2020-07-05 DIAGNOSIS — M9903 Segmental and somatic dysfunction of lumbar region: Secondary | ICD-10-CM

## 2020-07-05 DIAGNOSIS — M9905 Segmental and somatic dysfunction of pelvic region: Secondary | ICD-10-CM

## 2020-07-05 DIAGNOSIS — M5440 Lumbago with sciatica, unspecified side: Secondary | ICD-10-CM

## 2020-07-05 DIAGNOSIS — Z3400 Encounter for supervision of normal first pregnancy, unspecified trimester: Secondary | ICD-10-CM

## 2020-07-05 NOTE — Progress Notes (Signed)
   PRENATAL VISIT NOTE  Subjective:  Meagan Martin is a 20 y.o. G1P0 at [redacted]w[redacted]d being seen today for ongoing prenatal care.  She is currently monitored for the following issues for this low-risk pregnancy and has Supervision of normal first pregnancy, antepartum on their problem list.  Patient reports right lumbar pain with sciatica. Had since about 12 weeks. Worse with bending over.  Contractions: Not present. Vag. Bleeding: None.  Movement: Present. Denies leaking of fluid.   The following portions of the patient's history were reviewed and updated as appropriate: allergies, current medications, past family history, past medical history, past social history, past surgical history and problem list. Problem list updated.  Objective:   Vitals:   07/05/20 1041  BP: 106/66  Pulse: 95  Weight: 142 lb (64.4 kg)    Fetal Status: Fetal Heart Rate (bpm): 147   Movement: Present     General:  Alert, oriented and cooperative. Patient is in no acute distress.  Skin: Skin is warm and dry. No rash noted.   Cardiovascular: Normal heart rate noted  Respiratory: Normal respiratory effort, no problems with respiration noted  Abdomen: Soft, gravid, appropriate for gestational age. Pain/Pressure: Present     Pelvic:  Cervical exam deferred        MSK: Restriction, tenderness, tissue texture changes, and paraspinal spasm in the right lumbar spine  Neuro: Moves all four extremities with no focal neurological deficit  Extremities: Normal range of motion.  Edema: None  Mental Status: Normal mood and affect. Normal behavior. Normal judgment and thought content.   OSE: Head   Cervical   Thoracic   Rib   Lumbar L5 ESRR, L1 ESRL  Sacrum R/R  Pelvis R ant innom    Assessment and Plan:  Pregnancy: G1P0 at [redacted]w[redacted]d  1. [redacted] weeks gestation of pregnancy 2. Supervision of normal first pregnancy, antepartum FHT and FH normal  3. Back pain of lumbar region with sciatica 4. Somatic dysfunction of  lumbar region 5. Somatic dysfunction of sacral region 6. Somatic dysfunction of pelvis region OMT done after patient permission. HVLA technique utilized. 3 areas treated with improvement of tissue texture and joint mobility. Patient tolerated procedure well. Stretches taught  Preterm labor symptoms and general obstetric precautions including but not limited to vaginal bleeding, contractions, leaking of fluid and fetal movement were reviewed in detail with the patient. Please refer to After Visit Summary for other counseling recommendations.  Return in about 1 week (around 07/12/2020) for 2 hr GTT.  Levie Heritage, DO

## 2020-07-08 ENCOUNTER — Encounter (HOSPITAL_COMMUNITY): Payer: Self-pay | Admitting: Obstetrics and Gynecology

## 2020-07-08 ENCOUNTER — Other Ambulatory Visit: Payer: Self-pay

## 2020-07-08 ENCOUNTER — Inpatient Hospital Stay (HOSPITAL_COMMUNITY)
Admission: AD | Admit: 2020-07-08 | Discharge: 2020-07-09 | Disposition: A | Discharge status: Home / Self Care | Payer: 59 | Attending: Obstetrics and Gynecology | Admitting: Obstetrics and Gynecology

## 2020-07-08 DIAGNOSIS — Z3689 Encounter for other specified antenatal screening: Secondary | ICD-10-CM | POA: Insufficient documentation

## 2020-07-08 DIAGNOSIS — Z3A27 27 weeks gestation of pregnancy: Secondary | ICD-10-CM | POA: Insufficient documentation

## 2020-07-08 DIAGNOSIS — O98512 Other viral diseases complicating pregnancy, second trimester: Secondary | ICD-10-CM | POA: Diagnosis not present

## 2020-07-08 DIAGNOSIS — U071 COVID-19: Secondary | ICD-10-CM | POA: Diagnosis not present

## 2020-07-08 DIAGNOSIS — O368120 Decreased fetal movements, second trimester, not applicable or unspecified: Secondary | ICD-10-CM | POA: Insufficient documentation

## 2020-07-08 NOTE — MAU Provider Note (Addendum)
History     CSN: [id number]  Arrival date and time: 07/08/20 2201   Event Date/Time   First Provider Initiated Contact with Patient 07/08/20 2316      Chief Complaint  Patient presents with  . Decreased Fetal Movement   HPI Meagan Martin is a 20 y.o. G1P0 at [redacted]w[redacted]d who presents with decreased fetal movement. She states on 1/7 she had a positive rapid COVID test and had a PCR done but results are not back yet. She states today the baby has not been moving normally. She denies any abdominal pain, vaginal bleeding or discharge.   OB History    Gravida  1   Para      Term      Preterm      AB      Living        SAB      IAB      Ectopic      Multiple      Live Births              Past Medical History:  Diagnosis Date  . Medical history non-contributory     Past Surgical History:  Procedure Laterality Date  . NO PAST SURGERIES      Family History  Problem Relation Age of Onset  . Diabetes Mother   . Diabetes Father   . Cancer Maternal Grandmother        throat  . Intellectual disability Neg Hx   . Hypertension Neg Hx   . Stroke Neg Hx     Social History   Tobacco Use  . Smoking status: Never Smoker  . Smokeless tobacco: Never Used  Vaping Use  . Vaping Use: Never used  Substance Use Topics  . Alcohol use: Never  . Drug use: Never    Allergies: No Known Allergies  Medications Prior to Admission  Medication Sig Dispense Refill Last Dose  . calcium carbonate (TUMS - DOSED IN MG ELEMENTAL CALCIUM) 500 MG chewable tablet Chew 1 tablet by mouth daily.   Past Week at Unknown time  . Prenatal Vit-Fe Fumarate-FA (MULTIVITAMIN-PRENATAL) 27-0.8 MG TABS tablet Take 1 tablet by mouth daily at 12 noon.   07/08/2020 at Unknown time  . Butalbital-APAP-Caffeine 50-325-40 MG capsule Take 1-2 capsules by mouth every 6 (six) hours as needed for headache. (Patient not taking: No sig reported) 30 capsule 0     Review of Systems  Constitutional:  Negative.  Negative for fatigue and fever.  HENT: Negative.   Respiratory: Negative.  Negative for shortness of breath.   Cardiovascular: Negative.  Negative for chest pain.  Gastrointestinal: Negative.  Negative for abdominal pain, constipation, diarrhea, nausea and vomiting.  Genitourinary: Negative.  Negative for dysuria, vaginal bleeding and vaginal discharge.  Neurological: Negative.  Negative for dizziness and headaches.   Physical Exam   Blood pressure 116/90, pulse (!) 104, temperature 98.3 F (36.8 C), temperature source Oral, resp. rate 20, last menstrual period 12/25/2019, SpO2 100 %.  Patient Vitals for the past 24 hrs:  BP Temp Temp src Pulse Resp SpO2  07/09/20 0015 102/63 - - 95 - -  07/09/20 0014 - - - - - 100 %  07/09/20 0009 - - - - - 100 %  07/09/20 0004 - - - - - 100 %  07/09/20 0000 107/72 - - 90 - -  07/08/20 2359 - - - - - 100 %  07/08/20 2354 - - - - - 100 %  07/08/20 2349 - - - - - 100 %  07/08/20 2345 113/68 - - (!) 105 - -  07/08/20 2344 - - - - - 100 %  07/08/20 2339 - - - - - 100 %  07/08/20 2330 119/83 - - 92 - -  07/08/20 2329 - - - - - 100 %  07/08/20 2324 - - - - - 100 %  07/08/20 2319 - - - - - 99 %  07/08/20 2259 116/90 98.3 F (36.8 C) Oral (!) 104 20 100 %    Physical Exam Vitals and nursing note reviewed.  Constitutional:      General: She is not in acute distress.    Appearance: She is well-developed and well-nourished.  HENT:     Head: Normocephalic.  Eyes:     Pupils: Pupils are equal, round, and reactive to light.  Cardiovascular:     Rate and Rhythm: Normal rate and regular rhythm.     Heart sounds: Normal heart sounds.  Pulmonary:     Effort: Pulmonary effort is normal. No respiratory distress.     Breath sounds: Normal breath sounds.  Abdominal:     General: Bowel sounds are normal. There is no distension.     Palpations: Abdomen is soft.     Tenderness: There is no abdominal tenderness.  Skin:    General: Skin is warm  and dry.  Neurological:     Mental Status: She is alert and oriented to person, place, and time.  Psychiatric:        Mood and Affect: Mood and affect normal.        Behavior: Behavior normal.        Thought Content: Thought content normal.        Judgment: Judgment normal.    Fetal Tracing: reactive Baseline: 130 Variability: moderate Accels: 10x10 (appropriate for gestational age) Decels: none Toco: none  MAU Course  Procedures Results for orders placed or performed during the hospital encounter of 07/08/20 (from the past 24 hour(s))  Urinalysis, Routine w reflex microscopic Urine, Clean Catch     Status: Abnormal   Collection Time: 07/08/20 11:17 PM  Result Value Ref Range   Color, Urine YELLOW YELLOW   APPearance HAZY (A) CLEAR   Specific Gravity, Urine 1.018 1.005 - 1.030   pH 5.0 5.0 - 8.0   Glucose, UA NEGATIVE NEGATIVE mg/dL   Hgb urine dipstick NEGATIVE NEGATIVE   Bilirubin Urine NEGATIVE NEGATIVE   Ketones, ur 20 (A) NEGATIVE mg/dL   Protein, ur NEGATIVE NEGATIVE mg/dL   Nitrite NEGATIVE NEGATIVE   Leukocytes,Ua NEGATIVE NEGATIVE   MDM UA CBC, CMP, Protein/creat ratio NST reassuring for gestational age and patient now feeling normal fetal movement.   Care turned Tyler Aas CNM at [phone].  Rolm Bookbinder, CNM 07/09/20 12:24 AM  Care assumed at 0030.  Preeclampsia labs normal, all additional BP normotensive  Assessment and Plan   Decreased fetal movements in second trimester, single or unspecified fetus - Plan: Discharge patient  NST (non-stress test) reactive - Plan: Discharge patient  COVID-19 affecting pregnancy in second trimester - Plan: Discharge patient  Discharge home in stable condition with return precautions  Pt will follow up as scheduled at [facility], pt informed any visits within the next 10 days will either be rescheduled or switched to virtual  Edd Arbour, CNM, MSN, [facility] 07/09/20 2:15 AM

## 2020-07-08 NOTE — MAU Note (Signed)
Pt states she had Covid test on Friday. Had fever, chills, body aches.  She is waiting for results. She had a positive test at home. Has had decreased fetal movement since then,

## 2020-07-09 DIAGNOSIS — O98512 Other viral diseases complicating pregnancy, second trimester: Secondary | ICD-10-CM | POA: Diagnosis not present

## 2020-07-09 LAB — COMPREHENSIVE METABOLIC PANEL
ALT: 20 U/L (ref 0–44)
AST: 17 U/L (ref 15–41)
Albumin: 2.7 g/dL — ABNORMAL LOW (ref 3.5–5.0)
Alkaline Phosphatase: 91 U/L (ref 38–126)
Anion gap: 8 (ref 5–15)
BUN: <5 mg/dL — ABNORMAL LOW (ref 6–20)
CO2: 20 mmol/L — ABNORMAL LOW (ref 22–32)
Calcium: 8.6 mg/dL — ABNORMAL LOW (ref 8.9–10.3)
Chloride: 108 mmol/L (ref 98–111)
Creatinine, Ser: 0.51 mg/dL (ref 0.44–1.00)
GFR, Estimated: >60 mL/min (ref >60)
Glucose, Bld: 103 mg/dL — ABNORMAL HIGH (ref 70–99)
Potassium: 3.3 mmol/L — ABNORMAL LOW (ref 3.5–5.1)
Sodium: 136 mmol/L (ref 135–145)
Total Bilirubin: 0.3 mg/dL (ref 0.3–1.2)
Total Protein: 6.4 g/dL — ABNORMAL LOW (ref 6.5–8.1)

## 2020-07-09 LAB — URINALYSIS, ROUTINE W REFLEX MICROSCOPIC
Bilirubin Urine: NEGATIVE
Glucose, UA: NEGATIVE mg/dL
Hgb urine dipstick: NEGATIVE
Ketones, ur: 20 mg/dL — AB
Leukocytes,Ua: NEGATIVE
Nitrite: NEGATIVE
Protein, ur: NEGATIVE mg/dL
Specific Gravity, Urine: 1.018 (ref 1.005–1.030)
pH: 5 (ref 5.0–8.0)

## 2020-07-09 LAB — PROTEIN / CREATININE RATIO, URINE
Creatinine, Urine: 123.54 mg/dL
Protein Creatinine Ratio: 0.11 mg/mg{Cre} (ref 0.00–0.15)
Total Protein, Urine: 13 mg/dL

## 2020-07-09 LAB — CBC
HCT: 34 % — ABNORMAL LOW (ref 36.0–46.0)
Hemoglobin: 11.3 g/dL — ABNORMAL LOW (ref 12.0–15.0)
MCH: 31.1 pg (ref 26.0–34.0)
MCHC: 33.2 g/dL (ref 30.0–36.0)
MCV: 93.7 fL (ref 80.0–100.0)
Platelets: 137 10*3/uL — ABNORMAL LOW (ref 150–400)
RBC: 3.63 MIL/uL — ABNORMAL LOW (ref 3.87–5.11)
RDW: 12.1 % (ref 11.5–15.5)
WBC: 6.6 10*3/uL (ref 4.0–10.5)
nRBC: 0 % (ref 0.0–0.2)

## 2020-07-09 NOTE — Discharge Instructions (Signed)
10 Things You Can Do to Manage Your COVID-19 Symptoms at Home °If you have possible or confirmed COVID-19: °1. Stay home except to get medical care. °2. Monitor your symptoms carefully. If your symptoms get worse, call your healthcare provider immediately. °3. Get rest and stay hydrated. °4. If you have a medical appointment, call the healthcare provider ahead of time and tell them that you have or may have COVID-19. °5. For medical emergencies, call 911 and notify the dispatch personnel that you have or may have COVID-19. °6. Cover your cough and sneezes with a tissue or use the inside of your elbow. °7. Wash your hands often with soap and water for at least 20 seconds or clean your hands with an alcohol-based hand sanitizer that contains at least 60% alcohol. °8. As much as possible, stay in a specific room and away from other people in your home. Also, you should use a separate bathroom, if available. If you need to be around other people in or outside of the home, wear a mask. °9. Avoid sharing personal items with other people in your household, like dishes, towels, and bedding. °10. Clean all surfaces that are touched often, like counters, tabletops, and doorknobs. Use household cleaning sprays or wipes according to the label instructions. °cdc.gov/coronavirus °01/12/2020 °This information is not intended to replace advice given to you by your health care provider. Make sure you discuss any questions you have with your health care provider. °Document Revised: 04/29/2020 Document Reviewed: 04/29/2020 °Elsevier Patient Education © 2021 [employer] ° °Fetal Movement Counts °Patient Name: ________________________________________________ Patient Due Date: ____________________ ° °What is a fetal movement count? °A fetal movement count is the number of times that you feel your baby move during a certain amount of time. This may also be called a fetal kick count. A fetal movement count is recommended for every  pregnant woman. You may be asked to start counting fetal movements as early as week 28 of your pregnancy. °Pay attention to when your baby is most active. You may notice your baby's sleep and wake cycles. You may also notice things that make your baby move more. You should do a fetal movement count: °· When your baby is normally most active. °· At the same time each day. °A good time to count movements is while you are resting, after having something to eat and drink. °How do I count fetal movements? °1. Find a quiet, comfortable area. Sit, or lie down on your side. °2. Write down the date, the start time and stop time, and the number of movements that you felt between those two times. Take this information with you to your health care visits. °3. Write down your start time when you feel the first movement. °4. Count kicks, flutters, swishes, rolls, and jabs. You should feel at least 10 movements. °5. You may stop counting after you have felt 10 movements, or if you have been counting for 2 hours. Write down the stop time. °6. If you do not feel 10 movements in 2 hours, contact your health care provider for further instructions. Your health care provider may want to do additional tests to assess your baby's well-being. °Contact a health care provider if: °· You feel fewer than 10 movements in 2 hours. °· Your baby is not moving like he or she usually does. °Date: ____________ Start time: ____________ Stop time: ____________ Movements: ____________ °Date: ____________ Start time: ____________ Stop time: ____________ Movements: ____________ °Date: ____________ Start time: ____________ Stop   time: ____________ Movements: ____________ °Date: ____________ Start time: ____________ Stop time: ____________ Movements: ____________ °Date: ____________ Start time: ____________ Stop time: ____________ Movements: ____________ °Date: ____________ Start time: ____________ Stop time: ____________ Movements: ____________ °Date:  ____________ Start time: ____________ Stop time: ____________ Movements: ____________ °Date: ____________ Start time: ____________ Stop time: ____________ Movements: ____________ °Date: ____________ Start time: ____________ Stop time: ____________ Movements: ____________ °This information is not intended to replace advice given to you by your health care provider. Make sure you discuss any questions you have with your health care provider. °Document Revised: 02/02/2019 Document Reviewed: 02/02/2019 °Elsevier Patient Education © 2021 Elsevier Inc. ° °

## 2020-07-12 ENCOUNTER — Ambulatory Visit: Payer: 59

## 2020-07-16 ENCOUNTER — Encounter: Payer: 59 | Admitting: Advanced Practice Midwife

## 2020-07-22 ENCOUNTER — Ambulatory Visit: Payer: 59

## 2020-07-23 ENCOUNTER — Other Ambulatory Visit: Payer: Self-pay

## 2020-07-23 ENCOUNTER — Other Ambulatory Visit (INDEPENDENT_AMBULATORY_CARE_PROVIDER_SITE_OTHER): Payer: 59

## 2020-07-23 DIAGNOSIS — O26899 Other specified pregnancy related conditions, unspecified trimester: Secondary | ICD-10-CM | POA: Diagnosis not present

## 2020-07-23 DIAGNOSIS — Z3A29 29 weeks gestation of pregnancy: Secondary | ICD-10-CM | POA: Diagnosis not present

## 2020-07-23 DIAGNOSIS — Z6791 Unspecified blood type, Rh negative: Secondary | ICD-10-CM

## 2020-07-23 DIAGNOSIS — O360930 Maternal care for other rhesus isoimmunization, third trimester, not applicable or unspecified: Secondary | ICD-10-CM

## 2020-07-23 MED ORDER — RHO D IMMUNE GLOBULIN 1500 UNIT/2ML IJ SOSY
300.0000 ug | PREFILLED_SYRINGE | Freq: Once | INTRAMUSCULAR | Status: AC
  Administered 2020-07-23: 300 ug via INTRAMUSCULAR

## 2020-07-23 NOTE — Progress Notes (Signed)
Pt presents for 2 hr GTT and Rhogam injection. Pt was given injection and was sent to the lab for 2 hr GTT.  Meagan Martin l Abi Shoults, CMA

## 2020-07-24 LAB — CBC
Hematocrit: 34.2 % (ref 34.0–46.6)
Hemoglobin: 11.4 g/dL (ref 11.1–15.9)
MCH: 31 pg (ref 26.6–33.0)
MCHC: 33.3 g/dL (ref 31.5–35.7)
MCV: 93 fL (ref 79–97)
Platelets: 151 10*3/uL (ref 150–450)
RBC: 3.68 x10E6/uL — ABNORMAL LOW (ref 3.77–5.28)
RDW: 12 % (ref 11.7–15.4)
WBC: 8.2 10*3/uL (ref 3.4–10.8)

## 2020-07-24 LAB — GLUCOSE TOLERANCE, 2 HOURS W/ 1HR
Glucose, 1 hour: 162 mg/dL (ref 65–179)
Glucose, 2 hour: 109 mg/dL (ref 65–152)
Glucose, Fasting: 82 mg/dL (ref 65–91)

## 2020-07-24 LAB — HIV ANTIBODY (ROUTINE TESTING W REFLEX): HIV Screen 4th Generation wRfx: NONREACTIVE

## 2020-07-24 LAB — RPR: RPR Ser Ql: NONREACTIVE

## 2020-07-26 ENCOUNTER — Encounter: Payer: 59 | Admitting: Family Medicine

## 2020-07-30 ENCOUNTER — Encounter: Payer: Self-pay | Admitting: Advanced Practice Midwife

## 2020-07-30 ENCOUNTER — Other Ambulatory Visit: Payer: Self-pay

## 2020-07-30 ENCOUNTER — Ambulatory Visit (INDEPENDENT_AMBULATORY_CARE_PROVIDER_SITE_OTHER): Payer: 59 | Admitting: Advanced Practice Midwife

## 2020-07-30 VITALS — BP 117/77 | HR 108 | Wt 145.0 lb

## 2020-07-30 DIAGNOSIS — Z3A30 30 weeks gestation of pregnancy: Secondary | ICD-10-CM

## 2020-07-30 DIAGNOSIS — Z23 Encounter for immunization: Secondary | ICD-10-CM

## 2020-07-30 NOTE — Progress Notes (Signed)
   PRENATAL VISIT NOTE  Subjective:  Meagan Martin is a 20 y.o. G1P0 at [redacted]w[redacted]d being seen today for ongoing prenatal care.  She is currently monitored for the following issues for this low-risk pregnancy and has Supervision of normal first pregnancy, antepartum on their problem list.  Patient reports no complaints and had a couple of painless contractions.  Contractions: Irregular. Vag. Bleeding: None.  Movement: Present. Denies leaking of fluid.   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   Objective:   Vitals:   07/30/20 1115  BP: 117/77  Pulse: (!) 108  Weight: 145 lb (65.8 kg)    Fetal Status: Fetal Heart Rate (bpm): 147   Movement: Present     General:  Alert, oriented and cooperative. Patient is in no acute distress.  Skin: Skin is warm and dry. No rash noted.   Cardiovascular: Normal heart rate noted  Respiratory: Normal respiratory effort, no problems with respiration noted  Abdomen: Soft, gravid, appropriate for gestational age.  Pain/Pressure: Absent     Pelvic: Cervical exam deferred        Extremities: Normal range of motion.  Edema: None  Mental Status: Normal mood and affect. Normal behavior. Normal judgment and thought content.   Assessment and Plan:  Pregnancy: G1P0 at [redacted]w[redacted]d 1. [redacted] weeks gestation of pregnancy     Glucola normal      Hgb normal   2. High priority for 2019-nCoV vaccine     Had Covid 07/05/20     Plans booster in feb  Preterm labor symptoms and general obstetric precautions including but not limited to vaginal bleeding, contractions, leaking of fluid and fetal movement were reviewed in detail with the patient. Please refer to After Visit Summary for other counseling recommendations.    Future  Appointments  Date Time Provider Department Center  08/13/2020 10:35 AM Aviva Signs, CNM [facility] None  08/27/2020 10:20 AM Sharyon Cable, CNM [facility] None  09/10/2020 10:30 AM Aviva Signs, CNM [facility] None  09/17/2020 10:35 AM Aviva Signs, CNM [facility] None    Wynelle Bourgeois, CNM

## 2020-08-13 ENCOUNTER — Ambulatory Visit (INDEPENDENT_AMBULATORY_CARE_PROVIDER_SITE_OTHER): Payer: 59 | Admitting: Advanced Practice Midwife

## 2020-08-13 ENCOUNTER — Encounter: Payer: Self-pay | Admitting: Advanced Practice Midwife

## 2020-08-13 ENCOUNTER — Other Ambulatory Visit: Payer: Self-pay

## 2020-08-13 VITALS — BP 106/73 | HR 100 | Wt 148.0 lb

## 2020-08-13 DIAGNOSIS — Z3A32 32 weeks gestation of pregnancy: Secondary | ICD-10-CM

## 2020-08-13 NOTE — Patient Instructions (Signed)

## 2020-08-13 NOTE — Progress Notes (Signed)
   PRENATAL VISIT NOTE  Subjective:  Meagan Martin is a 20 y.o. G1P0 at [redacted]w[redacted]d being seen today for ongoing prenatal care.  She is currently monitored for the following issues for this low-risk pregnancy and has Supervision of normal first pregnancy, antepartum on their problem list.  Patient reports no complaints.  Contractions: Not present. Vag. Bleeding: None.  Movement: Present. Denies leaking of fluid.   The following portions of the patient's history were reviewed and updated as appropriate: allergies, current medications, past family history, past medical history, past social history, past surgical history and problem list.   Objective:   Vitals:   08/13/20 1044  BP: 106/73  Pulse: 100  Weight: 148 lb (67.1 kg)    Fetal Status: Fetal Heart Rate (bpm): 135   Movement: Present     General:  Alert, oriented and cooperative. Patient is in no acute distress.  Skin: Skin is warm and dry. No rash noted.   Cardiovascular: Normal heart rate noted  Respiratory: Normal respiratory effort, no problems with respiration noted  Abdomen: Soft, gravid, appropriate for gestational age.  Pain/Pressure: Present     Pelvic: Cervical exam deferred        Extremities: Normal range of motion.  Edema: None  Mental Status: Normal mood and affect. Normal behavior. Normal judgment and thought content.   Assessment and Plan:  Pregnancy: G1P0 at [redacted]w[redacted]d 1. [redacted] weeks gestation of pregnancy      Doing well      Glucola normal   Preterm labor symptoms and general obstetric precautions including but not limited to vaginal bleeding, contractions, leaking of fluid and fetal movement were reviewed in detail with the patient. Please refer to After Visit Summary for other counseling recommendations.   Return in about 2 weeks (around 08/27/2020) for [facility] [place].  Future Appointments  Date Time Provider Department Center  08/27/2020 10:20 AM Sharyon Cable, CNM [facility] None  09/10/2020 10:50  AM Aviva Signs, CNM [facility] None  09/17/2020 10:35 AM Aviva Signs, CNM [facility] None    Wynelle Bourgeois, CNM

## 2020-08-27 ENCOUNTER — Ambulatory Visit (INDEPENDENT_AMBULATORY_CARE_PROVIDER_SITE_OTHER): Payer: 59 | Admitting: Certified Nurse Midwife

## 2020-08-27 ENCOUNTER — Encounter: Payer: Self-pay | Admitting: Certified Nurse Midwife

## 2020-08-27 ENCOUNTER — Other Ambulatory Visit: Payer: Self-pay

## 2020-08-27 VITALS — BP 109/71 | HR 112 | Wt 151.0 lb

## 2020-08-27 DIAGNOSIS — Z3400 Encounter for supervision of normal first pregnancy, unspecified trimester: Secondary | ICD-10-CM

## 2020-08-27 DIAGNOSIS — Z3A34 34 weeks gestation of pregnancy: Secondary | ICD-10-CM

## 2020-08-27 NOTE — Patient Instructions (Signed)

## 2020-08-27 NOTE — Progress Notes (Signed)
   PRENATAL VISIT NOTE  Subjective:  Meagan Martin is a 20 y.o. G1P0 at [redacted]w[redacted]d being seen today for ongoing prenatal care.  She is currently monitored for the following issues for this low-risk pregnancy and has Supervision of normal first pregnancy, antepartum on their problem list.  Patient reports no complaints.  Contractions: Not present. Vag. Bleeding: None.  Movement: Present. Denies leaking of fluid.   The following portions of the patient's history were reviewed and updated as appropriate: allergies, current medications, past family history, past medical history, past social history, past surgical history and problem list.   Objective:   Vitals:   08/27/20 1028  BP: 109/71  Pulse: (!) 112  Weight: 151 lb (68.5 kg)    Fetal Status: Fetal Heart Rate (bpm): 140 Fundal Height: 32 cm Movement: Present     General:  Alert, oriented and cooperative. Patient is in no acute distress.  Skin: Skin is warm and dry. No rash noted.   Cardiovascular: Normal heart rate noted  Respiratory: Normal respiratory effort, no problems with respiration noted  Abdomen: Soft, gravid, appropriate for gestational age.  Pain/Pressure: Present     Pelvic: Cervical exam deferred        Extremities: Normal range of motion.  Edema: None  Mental Status: Normal mood and affect. Normal behavior. Normal judgment and thought content.   Assessment and Plan:  Pregnancy: G1P0 at [redacted]w[redacted]d 1. Supervision of normal first pregnancy, antepartum - patient doing well, no concerns at this time  - patient reports increased pelvic pain with walking over the past week, discussed with patient that fetus is head down at this stage and pressure of head with walking is normal  - PTL precautions discussed  - Discussed with patient preparations for hospital and bags, discussed what she needs and does not need to pack in hospital bag  - Educated and discussed GBS screening at next prenatal visit, discussed recommendations of  antibiotics during labor if positive, patient verbalizes understanding   2. [redacted] weeks gestation of pregnancy  Preterm labor symptoms and general obstetric precautions including but not limited to vaginal bleeding, contractions, leaking of fluid and fetal movement were reviewed in detail with the patient. Please refer to After Visit Summary for other counseling recommendations.   Return in about 2 weeks (around 09/10/2020) for LROB, GBS, in person.  Future Appointments  Date Time Provider Department Center  09/10/2020 11:10 AM Aviva Signs, CNM [facility] None  09/17/2020 10:35 AM Aviva Signs, CNM [facility] None    Sharyon Cable, CNM

## 2020-09-10 ENCOUNTER — Other Ambulatory Visit (HOSPITAL_COMMUNITY)
Admission: RE | Admit: 2020-09-10 | Discharge: 2020-09-10 | Disposition: A | Discharge status: Home / Self Care | Payer: 59 | Source: Ambulatory Visit | Attending: Advanced Practice Midwife | Admitting: Advanced Practice Midwife

## 2020-09-10 ENCOUNTER — Other Ambulatory Visit: Payer: Self-pay

## 2020-09-10 ENCOUNTER — Encounter: Payer: Self-pay | Admitting: Advanced Practice Midwife

## 2020-09-10 ENCOUNTER — Ambulatory Visit (INDEPENDENT_AMBULATORY_CARE_PROVIDER_SITE_OTHER): Payer: 59 | Admitting: Advanced Practice Midwife

## 2020-09-10 VITALS — BP 119/78 | HR 85 | Wt 155.0 lb

## 2020-09-10 DIAGNOSIS — Z3A36 36 weeks gestation of pregnancy: Secondary | ICD-10-CM | POA: Diagnosis present

## 2020-09-10 DIAGNOSIS — Z3400 Encounter for supervision of normal first pregnancy, unspecified trimester: Secondary | ICD-10-CM | POA: Diagnosis present

## 2020-09-10 DIAGNOSIS — Z3403 Encounter for supervision of normal first pregnancy, third trimester: Secondary | ICD-10-CM | POA: Insufficient documentation

## 2020-09-10 NOTE — Progress Notes (Signed)
   PRENATAL VISIT NOTE  Subjective:  Meagan Martin is a 20 y.o. G1P0 at [redacted]w[redacted]d being seen today for ongoing prenatal care.  She is currently monitored for the following issues for this low-risk pregnancy and has Supervision of normal first pregnancy, antepartum on their problem list.  Patient reports occasional contractions.  Contractions: Irregular. Vag. Bleeding: None.  Movement: Present. Denies leaking of fluid.   The following portions of the patient's history were reviewed and updated as appropriate: allergies, current medications, past family history, past medical history, past social history, past surgical history and problem list.   Objective:   Vitals:   09/10/20 1120  BP: 119/78  Pulse: 85  Weight: 155 lb (70.3 kg)    Fetal Status: Fetal Heart Rate (bpm): 142   Movement: Present     General:  Alert, oriented and cooperative. Patient is in no acute distress.  Skin: Skin is warm and dry. No rash noted.   Cardiovascular: Normal heart rate noted  Respiratory: Normal respiratory effort, no problems with respiration noted  Abdomen: Soft, gravid, appropriate for gestational age.  Pain/Pressure: Present     Pelvic: Cervical exam performed in the presence of a chaperone      Cervix FT/50/-3/vertex  Extremities: Normal range of motion.  Edema: Trace  Mental Status: Normal mood and affect. Normal behavior. Normal judgment and thought content.   Assessment and Plan:  Pregnancy: G1P0 at [redacted]w[redacted]d 1. [redacted] weeks gestation of pregnancy      GBS and cultures done      Answered questions about milk coming in, etc  Term labor symptoms and general obstetric precautions including but not limited to vaginal bleeding, contractions, leaking of fluid and fetal movement were reviewed in detail with the patient. Please refer to After Visit Summary for other counseling recommendations.     Future Appointments  Date Time Provider Department Center  09/17/2020 10:35 AM Aviva Signs, CNM  [facility] None  09/24/2020 11:15 AM Aviva Signs, CNM [facility] None    Wynelle Bourgeois, CNM

## 2020-09-10 NOTE — Patient Instructions (Signed)

## 2020-09-11 LAB — GC/CHLAMYDIA PROBE AMP (~~LOC~~) NOT AT ARMC
Chlamydia: NEGATIVE
Comment: NEGATIVE
Comment: NORMAL
Neisseria Gonorrhea: NEGATIVE

## 2020-09-14 LAB — CULTURE, BETA STREP (GROUP B ONLY): Strep Gp B Culture: NEGATIVE

## 2020-09-17 ENCOUNTER — Encounter: Payer: 59 | Admitting: Advanced Practice Midwife

## 2020-09-18 ENCOUNTER — Encounter: Payer: 59 | Admitting: Family Medicine

## 2020-09-24 ENCOUNTER — Encounter: Payer: 59 | Admitting: Advanced Practice Midwife

## 2020-09-25 ENCOUNTER — Encounter: Payer: Self-pay | Admitting: Obstetrics & Gynecology

## 2020-09-25 ENCOUNTER — Other Ambulatory Visit: Payer: Self-pay

## 2020-09-25 ENCOUNTER — Ambulatory Visit (INDEPENDENT_AMBULATORY_CARE_PROVIDER_SITE_OTHER): Payer: 59 | Admitting: Obstetrics & Gynecology

## 2020-09-25 ENCOUNTER — Other Ambulatory Visit: Payer: Self-pay | Admitting: Advanced Practice Midwife

## 2020-09-25 VITALS — BP 126/85 | HR 77 | Wt 164.0 lb

## 2020-09-25 DIAGNOSIS — Z3400 Encounter for supervision of normal first pregnancy, unspecified trimester: Secondary | ICD-10-CM

## 2020-09-25 DIAGNOSIS — O2600 Excessive weight gain in pregnancy, unspecified trimester: Secondary | ICD-10-CM

## 2020-09-25 NOTE — Progress Notes (Signed)
   PRENATAL VISIT NOTE  Subjective:  Meagan Martin is a 20 y.o. G1P0 at [redacted]w[redacted]d being seen today for ongoing prenatal care.  She is currently monitored for the following issues for this low-risk pregnancy and has Supervision of normal first pregnancy, antepartum on their problem list.  Patient reports severe HA 2 night ago that resolved only after a night sleep. She also reports that her partner has noted that her face is getting really swollen. Pt reports swelling in feel bilaterally and she cant wear most shoes. She denies visual changes.  . Vag. Bleeding: None.  Movement: Present. Denies leaking of fluid.   The following portions of the patient's history were reviewed and updated as appropriate: allergies, current medications, past family history, past medical history, past social history, past surgical history and problem list.   Objective:   Vitals:   09/25/20 1315  BP: 126/85  Pulse: 77  Weight: 164 lb (74.4 kg)    Fetal Status: Fetal Heart Rate (bpm): 140 Fundal Height: 36 cm Movement: Present     General:  Alert, oriented and cooperative. Patient is in no acute distress.  Skin: Skin is warm and dry. No rash noted.   Cardiovascular: Normal heart rate noted  Respiratory: Normal respiratory effort, no problems with respiration noted  Abdomen: Soft, gravid, appropriate for gestational age.  Pain/Pressure: Present     Pelvic: Cervical exam deferred        Extremities: Normal range of motion.  Edema: Trace  Mental Status: Normal mood and affect. Normal behavior. Normal judgment and thought content.   Assessment and Plan:  Pregnancy: G1P0 at [redacted]w[redacted]d 1. Supervision of normal first pregnancy, antepartum See below  2. Excessive weight gain during pregnancy, antepartum Pt gained 9#s in 2 weeks.  No change in diet.  LE edema not severe.  Urine prot- trace BP  WNL  Pt is term with excessive weight gain, facial swelling and bilateral pedal edema. Given her GA, I have opted to  schedule for her IOL. Her HA in the face of the above is also concerning although it has resolved.      Term labor symptoms and general obstetric precautions including but not limited to vaginal bleeding, contractions, leaking of fluid and fetal movement were reviewed in detail with the patient. Please refer to After Visit Summary for other counseling recommendations.   Return in about 1 week (around 10/02/2020).  No future appointments.  Willodean Rosenthal, MD

## 2020-09-25 NOTE — Patient Instructions (Signed)
Natural Childbirth Natural childbirth is when labor and delivery progresses naturally with the expectation of a vaginal delivery. The goal is to use minimal medical assistance and no medicines related to labor or pain. Relaxation techniques and controlled breathing are used to manage labor pains. Natural childbirth may be an option for women who have a low-risk pregnancy. Many women choose natural childbirth to feel more in touch with the experience of giving birth. Some women choose natural childbirth to avoid the effects of medicines on their babies and on their bodies. What are the advantages?  You are in control of your labor and delivery experience.  You may be able to avoid some common medical practices, such as fetal heart monitoring or getting medicines.  You and your spouse or partner will work together, which can increase your bond with each other.  In most birthing centers, your family and friends can be involved in the labor and delivery process. What are the disadvantages?  Relaxation and other methods to help relieve labor pains may not work for you.  You may feel disappointed if you change your mind during labor and decide not to have a natural childbirth. Natural pain and relaxation techniques If you are considering a natural childbirth, you should explore the options for managing pain without medicines and learn about techniques to cope with pain and discomfort during your labor and delivery. Some natural pain control and relaxation techniques include:  Yoga or meditation.  Acupuncture.  Massage.  Hypnosis, including: ? Hypnotherapy. This is delivered in person by an experienced practitioner. ? Self-hypnosis. This is when the mother is trained to induce a state of altered consciousness.  Listening to soft music.  Finding an activity that keeps your mind off the labor pain or focusing on a particular object (visual imagery).  Lying in warm water or a whirlpool  bath.  Changing positions by walking, rocking, showering, or leaning on birth balls.   How can I prepare for a natural birth? Early preparation:  Talk with your spouse or partner about your goals for having a natural childbirth.  Decide which type of health care provider you would like to assist you with your delivery. This could be an obstetrician, a midwife, or a doula, for example.  Decide if you will have your baby in the hospital, at a birthing center, or at home.  Talk with your health care provider about the possibility of a medical emergency and what will happen if that occurs. Preparation as labor and delivery gets closer:  Have your spouse or partner attend the natural childbirth technique classes with you.  If you have other children, plan to have someone care for them when you go to the hospital or birthing center.  Know the distance and time it takes to get to the hospital or birthing center. Practice going there and time it to be sure.  Keep phone numbers of your family and friends handy if you need to call someone when you go into labor.  Have a bag packed with a nightgown, bathrobe, and toiletries. Be ready to take it with you when you go into labor. What can I expect after delivery? You may feel:  Very tired.  Pain and discomfort as your uterus contracts.  Pain and discomfort in the area around your vagina and rectum.  Cold and shaky. Questions to ask your health care provider  Am I a good candidate for natural childbirth?  Can you refer me to classes to learn more   about natural childbirth?  How do I create a birth plan that outlines my wishes for natural childbirth? Where to find more information  American Pregnancy Association: americanpregnancy.org  [employer]: acog.org  [employer] Nurse-Midwives: www.midwife.org Summary  Natural childbirth is when labor and delivery progresses naturally with the  expectation of a vaginal delivery. The goal is to use minimal medical assistance and no medicines related to labor or pain.  Natural childbirth may be an option if you have a low-risk pregnancy.  Many women choose natural childbirth because it makes them feel more in control and in touch with the experience of giving birth.  Talk to your health care provider to determine if you are a good candidate for a natural childbirth. This information is not intended to replace advice given to you by your health care provider. Make sure you discuss any questions you have with your health care provider. Document Revised: 04/06/2020 Document Reviewed: 04/06/2020 Elsevier Patient Education  2021 [employer].

## 2020-09-26 ENCOUNTER — Inpatient Hospital Stay (HOSPITAL_COMMUNITY)
Admission: AD | Admit: 2020-09-26 | Discharge: 2020-09-29 | DRG: 806 | Disposition: A | Discharge status: Home / Self Care | Payer: 59 | Attending: Obstetrics & Gynecology | Admitting: Obstetrics & Gynecology

## 2020-09-26 ENCOUNTER — Encounter (HOSPITAL_COMMUNITY): Payer: Self-pay | Admitting: Obstetrics & Gynecology

## 2020-09-26 ENCOUNTER — Inpatient Hospital Stay (HOSPITAL_COMMUNITY): Payer: 59

## 2020-09-26 ENCOUNTER — Other Ambulatory Visit: Payer: Self-pay

## 2020-09-26 DIAGNOSIS — D6959 Other secondary thrombocytopenia: Secondary | ICD-10-CM | POA: Diagnosis present

## 2020-09-26 DIAGNOSIS — Z3A39 39 weeks gestation of pregnancy: Secondary | ICD-10-CM | POA: Diagnosis not present

## 2020-09-26 DIAGNOSIS — O2600 Excessive weight gain in pregnancy, unspecified trimester: Secondary | ICD-10-CM | POA: Diagnosis present

## 2020-09-26 DIAGNOSIS — Z6791 Unspecified blood type, Rh negative: Secondary | ICD-10-CM | POA: Diagnosis not present

## 2020-09-26 DIAGNOSIS — O26893 Other specified pregnancy related conditions, third trimester: Secondary | ICD-10-CM | POA: Diagnosis present

## 2020-09-26 DIAGNOSIS — O1204 Gestational edema, complicating childbirth: Secondary | ICD-10-CM | POA: Diagnosis present

## 2020-09-26 DIAGNOSIS — Z3400 Encounter for supervision of normal first pregnancy, unspecified trimester: Secondary | ICD-10-CM

## 2020-09-26 DIAGNOSIS — O1404 Mild to moderate pre-eclampsia, complicating childbirth: Secondary | ICD-10-CM | POA: Diagnosis present

## 2020-09-26 DIAGNOSIS — D509 Iron deficiency anemia, unspecified: Secondary | ICD-10-CM

## 2020-09-26 DIAGNOSIS — O9912 Other diseases of the blood and blood-forming organs and certain disorders involving the immune mechanism complicating childbirth: Secondary | ICD-10-CM | POA: Diagnosis present

## 2020-09-26 DIAGNOSIS — O9902 Anemia complicating childbirth: Secondary | ICD-10-CM | POA: Diagnosis present

## 2020-09-26 DIAGNOSIS — O1400 Mild to moderate pre-eclampsia, unspecified trimester: Secondary | ICD-10-CM | POA: Diagnosis present

## 2020-09-26 LAB — TYPE AND SCREEN
ABO/RH(D): O NEG
Antibody Screen: NEGATIVE

## 2020-09-26 LAB — COMPREHENSIVE METABOLIC PANEL
ALT: 15 U/L (ref 0–44)
AST: 20 U/L (ref 15–41)
Albumin: 2.6 g/dL — ABNORMAL LOW (ref 3.5–5.0)
Alkaline Phosphatase: 156 U/L — ABNORMAL HIGH (ref 38–126)
Anion gap: 8 (ref 5–15)
BUN: 9 mg/dL (ref 6–20)
CO2: 20 mmol/L — ABNORMAL LOW (ref 22–32)
Calcium: 8.7 mg/dL — ABNORMAL LOW (ref 8.9–10.3)
Chloride: 107 mmol/L (ref 98–111)
Creatinine, Ser: 0.51 mg/dL (ref 0.44–1.00)
GFR, Estimated: >60 mL/min (ref >60)
Glucose, Bld: 83 mg/dL (ref 70–99)
Potassium: 3.7 mmol/L (ref 3.5–5.1)
Sodium: 135 mmol/L (ref 135–145)
Total Bilirubin: 0.7 mg/dL (ref 0.3–1.2)
Total Protein: 6 g/dL — ABNORMAL LOW (ref 6.5–8.1)

## 2020-09-26 LAB — CBC
HCT: 30.5 % — ABNORMAL LOW (ref 36.0–46.0)
Hemoglobin: 9.6 g/dL — ABNORMAL LOW (ref 12.0–15.0)
MCH: 27.8 pg (ref 26.0–34.0)
MCHC: 31.5 g/dL (ref 30.0–36.0)
MCV: 88.4 fL (ref 80.0–100.0)
Platelets: 136 10*3/uL — ABNORMAL LOW (ref 150–400)
RBC: 3.45 MIL/uL — ABNORMAL LOW (ref 3.87–5.11)
RDW: 14 % (ref 11.5–15.5)
WBC: 6.5 10*3/uL (ref 4.0–10.5)
nRBC: 0 % (ref 0.0–0.2)

## 2020-09-26 LAB — PROTEIN / CREATININE RATIO, URINE
Creatinine, Urine: 31.61 mg/dL
Protein Creatinine Ratio: 2.31 mg/mg{Cre} — ABNORMAL HIGH (ref 0.00–0.15)
Total Protein, Urine: 73 mg/dL

## 2020-09-26 LAB — RPR: RPR Ser Ql: NONREACTIVE

## 2020-09-26 MED ORDER — FENTANYL CITRATE (PF) 100 MCG/2ML IJ SOLN
100.0000 ug | INTRAMUSCULAR | Status: DC | PRN
  Administered 2020-09-26 – 2020-09-27 (×4): 100 ug via INTRAVENOUS
  Filled 2020-09-26 (×3): qty 2

## 2020-09-26 MED ORDER — OXYTOCIN-SODIUM CHLORIDE 30-0.9 UT/500ML-% IV SOLN
2.5000 [IU]/h | INTRAVENOUS | Status: DC
  Administered 2020-09-27: 2.5 [IU]/h via INTRAVENOUS
  Filled 2020-09-26 (×2): qty 500

## 2020-09-26 MED ORDER — BUTORPHANOL TARTRATE 1 MG/ML IJ SOLN: 1.0000 mg | INTRAMUSCULAR | Status: DC | PRN

## 2020-09-26 MED ORDER — TERBUTALINE SULFATE 1 MG/ML IJ SOLN: 0.2500 mg | Freq: Once | INTRAMUSCULAR | Status: DC | PRN

## 2020-09-26 MED ORDER — OXYCODONE-ACETAMINOPHEN 5-325 MG PO TABS: 1.0000 | ORAL_TABLET | ORAL | Status: DC | PRN

## 2020-09-26 MED ORDER — SOD CITRATE-CITRIC ACID 500-334 MG/5ML PO SOLN
30.0000 mL | ORAL | Status: DC | PRN
  Administered 2020-09-27: 30 mL via ORAL
  Filled 2020-09-26: qty 15

## 2020-09-26 MED ORDER — OXYTOCIN BOLUS FROM INFUSION
333.0000 mL | Freq: Once | INTRAVENOUS | Status: AC
  Administered 2020-09-27: 333 mL via INTRAVENOUS

## 2020-09-26 MED ORDER — LACTATED RINGERS IV SOLN: 500.0000 mL | INTRAVENOUS | Status: DC | PRN

## 2020-09-26 MED ORDER — MISOPROSTOL 25 MCG QUARTER TABLET
25.0000 ug | ORAL_TABLET | ORAL | Status: DC | PRN
  Administered 2020-09-26 (×2): 25 ug via VAGINAL
  Filled 2020-09-26 (×2): qty 1

## 2020-09-26 MED ORDER — LACTATED RINGERS IV SOLN: INTRAVENOUS | Status: DC

## 2020-09-26 MED ORDER — LIDOCAINE HCL (PF) 1 % IJ SOLN: 30.0000 mL | INTRAMUSCULAR | Status: DC | PRN

## 2020-09-26 MED ORDER — FENTANYL CITRATE (PF) 100 MCG/2ML IJ SOLN
INTRAMUSCULAR | Status: AC
  Filled 2020-09-26: qty 2

## 2020-09-26 MED ORDER — OXYTOCIN-SODIUM CHLORIDE 30-0.9 UT/500ML-% IV SOLN
1.0000 m[IU]/min | INTRAVENOUS | Status: DC
  Administered 2020-09-26: 2 m[IU]/min via INTRAVENOUS

## 2020-09-26 MED ORDER — ONDANSETRON HCL 4 MG/2ML IJ SOLN
4.0000 mg | Freq: Four times a day (QID) | INTRAMUSCULAR | Status: DC | PRN
  Administered 2020-09-26 – 2020-09-27 (×2): 4 mg via INTRAVENOUS
  Filled 2020-09-26 (×2): qty 2

## 2020-09-26 NOTE — H&P (Signed)
Meagan Martin is a 20 y.o. female G1P0 with IUP at [redacted]w[redacted]d by 8 wk [place] presenting for IOL for increased weight gain, prior HA and facial swelling. BP WNL. Patient was seen in clinic yesterday and reported these complaints; MD recommended IOL due to concerns for developing pre-e.  She reports positive fetal movement. She denies leakage of fluid or vaginal bleeding.  Prenatal History/Complications: PNC at [facility] Pregnancy complications:  - Past Medical History: Past Medical History:  Diagnosis Date  . Medical history non-contributory     Past Surgical History: Past Surgical History:  Procedure Laterality Date  . NO PAST SURGERIES      Obstetrical History: OB History    Gravida  1   Para      Term      Preterm      AB      Living        SAB      IAB      Ectopic      Multiple      Live Births               Social History: Social History   Socioeconomic History  . Marital status: Significant Other    Spouse name: Not on file  . Number of children: Not on file  . Years of education: Not on file  . Highest education level: Some college, no degree  Occupational History  . Occupation: Consulting civil engineer  Tobacco Use  . Smoking status: Never Smoker  . Smokeless tobacco: Never Used  Vaping Use  . Vaping Use: Never used  Substance and Sexual Activity  . Alcohol use: Never  . Drug use: Never  . Sexual activity: Yes    Birth control/protection: None    Comment: 2 days ago   Other Topics Concern  . Not on file  Social History Narrative  . Not on file   Social Determinants of Health   Financial Resource Strain: Not on file  Food Insecurity: Not on file  Transportation Needs: Not on file  Physical Activity: Not on file  Stress: Not on file  Social Connections: Not on file    Family History: Family History  Problem Relation Age of Onset  . Diabetes Mother   . Diabetes Father   . Cancer Maternal Grandmother        throat  . Intellectual  disability Neg Hx   . Hypertension Neg Hx   . Stroke Neg Hx     Allergies: No Known Allergies  Medications Prior to Admission  Medication Sig Dispense Refill Last Dose  . calcium carbonate (TUMS - DOSED IN MG ELEMENTAL CALCIUM) 500 MG chewable tablet Chew 1 tablet by mouth daily as needed for indigestion or heartburn.   Past Week at Unknown time  . Prenatal Vit-Fe Fumarate-FA (PRENATAL MULTIVITAMIN) TABS tablet Take 1 tablet by mouth daily at 12 noon.   09/25/2020 at Unknown time    Review of Systems   Constitutional: Negative for fever and chills Eyes: Negative for visual disturbances Respiratory: Negative for shortness of breath, dyspnea Cardiovascular: Negative for chest pain or palpitations  Gastrointestinal: Negative for vomiting, diarrhea and constipation.  POSITIVE for abdominal pain (contractions) Genitourinary: Negative for dysuria and urgency Musculoskeletal: Negative for back pain, joint pain, myalgias  Neurological: Negative for dizziness and headaches  Blood pressure 130/86, pulse 76, temperature 97.7 F (36.5 C), temperature source Oral, last menstrual period 12/25/2019. General appearance: alert, cooperative and no distress Lungs: normal respiratory  effort Heart: regular rate and rhythm Abdomen: soft, non-tender; bowel sounds normal Extremities: Homans sign is negative, no sign of DVT DTR's 2+ Presentation: cephalic by beside [place] on L and D Fetal monitoring  Baseline: 125 bpm; mod var, present acel, no decels at 0900 Uterine activity  None Dilation: Fingertip Effacement (%): 50 Station: -3 Exam by:: BZhang,RN/ S.Reed Pandy, RN   Prenatal labs: ABO, Rh: --/--/O NEG (03/31 0905) Antibody: NEG (03/31 0905) Rubella: 3.31 (08/31 0931) RPR: NON REACTIVE (03/31 0908)  HBsAg: Negative (08/31 0931)  HIV: Non Reactive (01/25 0955)  GBS: Negative/-- (03/15 1147)  1 hr Glucola passed Genetic screening  Low risk Anatomy US normal  Prenatal Transfer Tool  Maternal  Diabetes: No Genetic Screening: Normal Maternal Ultrasounds/Referrals: Normal Fetal Ultrasounds or other Referrals:  None Maternal Substance Abuse:  No Significant Maternal Medications:  None Significant Maternal Lab Results: Group B Strep negative and Rh negative  Results for orders placed or performed during the hospital encounter of 09/26/20 (from the past 24 hour(s))  Type and screen   Collection Time: 09/26/20  9:05 AM  Result Value Ref Range   ABO/RH(D) O NEG    Antibody Screen NEG    Sample Expiration      09/29/2020,2359 Performed at [facility], [address], [place], [place] [address]   CBC   Collection Time: 09/26/20  9:08 AM  Result Value Ref Range   WBC 6.5 4.0 - 10.5 K/uL   RBC 3.45 (L) 3.87 - 5.11 MIL/uL   Hemoglobin 9.6 (L) 12.0 - 15.0 g/dL   HCT 53.9 (L) 76.7 - 34.1 %   MCV 88.4 80.0 - 100.0 fL   MCH 27.8 26.0 - 34.0 pg   MCHC 31.5 30.0 - 36.0 g/dL   RDW 93.7 90.2 - 40.9 %   Platelets 136 (L) 150 - 400 K/uL   nRBC 0.0 0.0 - 0.2 %  RPR   Collection Time: 09/26/20  9:08 AM  Result Value Ref Range   RPR Ser Ql NON REACTIVE NON REACTIVE    Assessment: Meagan Martin is a 20 y.o. G1P0 with an IUP at [redacted]w[redacted]d presenting for IOL due to increased facial swelling, weight gain and previous HA now resolved.  One elevated BP since admission, patient is otherwise symptomatic.  Patient Vitals for the past 24 hrs:  BP Temp Temp src Pulse  09/26/20 1035 130/86 -- -- 76  09/26/20 0935 (!) 134/97 -- -- 82  09/26/20 0934 -- 97.7 F (36.5 C) Oral --  09/26/20 0835 124/80 -- -- 94  09/26/20 0828 -- 98.7 F (37.1 C) Oral --  09/26/20 0816 133/87 -- -- Marland Kitchen 106     Plan: #Labor: expectant management #Pain:  Per request #FWB Cat 1 #ID: GBS: neg #MOF:  breast #MOC: Depo #Circ: NA  -confirmed cephalic by bedside US -RN placed cytotec vaginally -continue to watch BPs and will treat as necessary  Charlesetta Garibaldi Tienna Bienkowski 09/26/2020, 11:54 AM

## 2020-09-26 NOTE — Progress Notes (Addendum)
Labor Progress Note Meagan Martin is a 20 y.o. G1P0 at [redacted]w[redacted]d presented for elevated BP, facial swelling, now with diagnosis of PEC w/o SF.   S: Feeling pain with contractions but doing well overall   O:  BP (!) 154/93   Pulse 90   Temp 98.3 F (36.8 C) (Oral)   Resp 16   LMP 12/25/2019  EFM: 120/mod variability/pos accels/no decels   CVE: Dilation: 4 Effacement (%): 60 Cervical Position: Posterior Station: -2 Presentation: Vertex Exam by:: Dr. Myriam Jacobson   A&P: 20 y.o. G1P0 [redacted]w[redacted]d here for IOL for PEC w/o SF.   #Labor: s/p cytotec x2, FB. Pitocin started at 1900, FB now out. Continue to uptitrate pitocin.  #PEC w/o SF: based on MR pressures and UPC 2.31. Continues to have MR pressures, no severe. Will continue to monitor.  #gestational thrombocytopenia: plt stable at 136  #Pain: pain meds, epidural prn  #FWB: cat I  #GBS negative     Gita Kudo, MD 8:31 PM

## 2020-09-26 NOTE — Progress Notes (Addendum)
   Lexa Debbe Odea is a 20 y.o. G1P0 at [redacted]w[redacted]d  admitted for IOL for concern with headache, facial swelling and weight gain of 9 lbs in two weeks. BP was normal, however, decision was made to induce.   Subjective: Patient coping well; denies blurry vision, HA, floating spots, RUQ pain, overall swelling. She is in bed watching TV.   Objective: Vitals:   09/26/20 1201 09/26/20 1219 09/26/20 1331 09/26/20 1505  BP: (!) 139/98 (!) 138/95 (!) 135/94 (!) 129/91  Pulse: 77 84 80 82  Resp:  18    Temp:   98.7 F (37.1 C)   TempSrc:   Oral    No intake/output data recorded.  FHT:  FHR: 135 bpm, variability: moderate,  accelerations:  Present,  decelerations:  Absent UC:   Uterine irratability SVE:   Dilation: Fingertip Effacement (%): Thick Station: -3 Exam by:: Janeice Robinson, RN  Labs: Lab Results  Component Value Date   WBC 6.5 09/26/2020   HGB 9.6 (L) 09/26/2020   HCT 30.5 (L) 09/26/2020   MCV 88.4 09/26/2020   PLT 136 (L) 09/26/2020    Assessment / Plan: early labor, FB unable to be placed after two attemps. Will continue with cytotec q 4 hours and retry this evening Patient wants to eat a light meal; diet order given.  Labor: early labor, s/p 2 doses of cytotec Fetal Wellbeing:  Category I Pain Control:  Labor support without medications Anticipated MOD:  NSVD  Marylene Land 09/26/2020, 4:28 PM

## 2020-09-27 ENCOUNTER — Inpatient Hospital Stay (HOSPITAL_COMMUNITY): Payer: 59 | Admitting: Anesthesiology

## 2020-09-27 ENCOUNTER — Encounter (HOSPITAL_COMMUNITY): Payer: Self-pay | Admitting: Obstetrics

## 2020-09-27 DIAGNOSIS — Z3A39 39 weeks gestation of pregnancy: Secondary | ICD-10-CM

## 2020-09-27 DIAGNOSIS — O1404 Mild to moderate pre-eclampsia, complicating childbirth: Secondary | ICD-10-CM

## 2020-09-27 LAB — COMPREHENSIVE METABOLIC PANEL
ALT: 14 U/L (ref 0–44)
AST: 18 U/L (ref 15–41)
Albumin: 2.1 g/dL — ABNORMAL LOW (ref 3.5–5.0)
Alkaline Phosphatase: 160 U/L — ABNORMAL HIGH (ref 38–126)
Anion gap: 6 (ref 5–15)
BUN: <5 mg/dL — ABNORMAL LOW (ref 6–20)
CO2: 22 mmol/L (ref 22–32)
Calcium: 8 mg/dL — ABNORMAL LOW (ref 8.9–10.3)
Chloride: 107 mmol/L (ref 98–111)
Creatinine, Ser: 0.55 mg/dL (ref 0.44–1.00)
GFR, Estimated: >60 mL/min (ref >60)
Glucose, Bld: 84 mg/dL (ref 70–99)
Potassium: 3.8 mmol/L (ref 3.5–5.1)
Sodium: 135 mmol/L (ref 135–145)
Total Bilirubin: 0.6 mg/dL (ref 0.3–1.2)
Total Protein: 5 g/dL — ABNORMAL LOW (ref 6.5–8.1)

## 2020-09-27 LAB — CBC
HCT: 30.5 % — ABNORMAL LOW (ref 36.0–46.0)
HCT: 28.8 % — ABNORMAL LOW (ref 36.0–46.0)
Hemoglobin: 9.9 g/dL — ABNORMAL LOW (ref 12.0–15.0)
Hemoglobin: 9.2 g/dL — ABNORMAL LOW (ref 12.0–15.0)
MCH: 28.4 pg (ref 26.0–34.0)
MCH: 28 pg (ref 26.0–34.0)
MCHC: 32.5 g/dL (ref 30.0–36.0)
MCHC: 31.9 g/dL (ref 30.0–36.0)
MCV: 87.4 fL (ref 80.0–100.0)
MCV: 87.8 fL (ref 80.0–100.0)
Platelets: 134 10*3/uL — ABNORMAL LOW (ref 150–400)
Platelets: 124 10*3/uL — ABNORMAL LOW (ref 150–400)
RBC: 3.49 MIL/uL — ABNORMAL LOW (ref 3.87–5.11)
RBC: 3.28 MIL/uL — ABNORMAL LOW (ref 3.87–5.11)
RDW: 14.1 % (ref 11.5–15.5)
RDW: 14 % (ref 11.5–15.5)
WBC: 10.2 10*3/uL (ref 4.0–10.5)
WBC: 12.9 10*3/uL — ABNORMAL HIGH (ref 4.0–10.5)
nRBC: 0 % (ref 0.0–0.2)
nRBC: 0.2 % (ref 0.0–0.2)

## 2020-09-27 MED ORDER — FENTANYL-BUPIVACAINE-NACL 0.5-0.125-0.9 MG/250ML-% EP SOLN
12.0000 mL/h | EPIDURAL | Status: DC | PRN
  Administered 2020-09-27: 12 mL/h via EPIDURAL
  Filled 2020-09-27: qty 250

## 2020-09-27 MED ORDER — PHENYLEPHRINE 40 MCG/ML (10ML) SYRINGE FOR IV PUSH (FOR BLOOD PRESSURE SUPPORT): 80.0000 ug | PREFILLED_SYRINGE | INTRAVENOUS | Status: DC | PRN

## 2020-09-27 MED ORDER — WITCH HAZEL-GLYCERIN EX PADS: 1.0000 "application " | MEDICATED_PAD | CUTANEOUS | Status: DC | PRN

## 2020-09-27 MED ORDER — ONDANSETRON HCL 4 MG/2ML IJ SOLN: 4.0000 mg | INTRAMUSCULAR | Status: DC | PRN

## 2020-09-27 MED ORDER — MEDROXYPROGESTERONE ACETATE 150 MG/ML IM SUSP: 150.0000 mg | Freq: Once | INTRAMUSCULAR | Status: DC

## 2020-09-27 MED ORDER — LACTATED RINGERS IV SOLN: 500.0000 mL | Freq: Once | INTRAVENOUS | Status: DC

## 2020-09-27 MED ORDER — TRANEXAMIC ACID-NACL 1000-0.7 MG/100ML-% IV SOLN
INTRAVENOUS | Status: AC
  Filled 2020-09-27: qty 100

## 2020-09-27 MED ORDER — BUPIVACAINE HCL (PF) 0.25 % IJ SOLN
INTRAMUSCULAR | Status: DC | PRN
  Administered 2020-09-27 (×3): 8 mL via EPIDURAL

## 2020-09-27 MED ORDER — SIMETHICONE 80 MG PO CHEW: 80.0000 mg | CHEWABLE_TABLET | ORAL | Status: DC | PRN

## 2020-09-27 MED ORDER — COCONUT OIL OIL: 1.0000 "application " | TOPICAL_OIL | Status: DC | PRN

## 2020-09-27 MED ORDER — ONDANSETRON HCL 4 MG PO TABS: 4.0000 mg | ORAL_TABLET | ORAL | Status: DC | PRN

## 2020-09-27 MED ORDER — IBUPROFEN 600 MG PO TABS
600.0000 mg | ORAL_TABLET | Freq: Four times a day (QID) | ORAL | Status: DC
  Administered 2020-09-27 – 2020-09-29 (×7): 600 mg via ORAL
  Filled 2020-09-27 (×7): qty 1

## 2020-09-27 MED ORDER — TETANUS-DIPHTH-ACELL PERTUSSIS 5-2.5-18.5 LF-MCG/0.5 IM SUSY: 0.5000 mL | PREFILLED_SYRINGE | Freq: Once | INTRAMUSCULAR | Status: DC

## 2020-09-27 MED ORDER — ACETAMINOPHEN 325 MG PO TABS
650.0000 mg | ORAL_TABLET | Freq: Four times a day (QID) | ORAL | Status: DC
  Administered 2020-09-27 – 2020-09-29 (×7): 650 mg via ORAL
  Filled 2020-09-27 (×7): qty 2

## 2020-09-27 MED ORDER — SENNOSIDES-DOCUSATE SODIUM 8.6-50 MG PO TABS
2.0000 | ORAL_TABLET | Freq: Every day | ORAL | Status: DC
  Administered 2020-09-28 – 2020-09-29 (×2): 2 via ORAL
  Filled 2020-09-27 (×2): qty 2

## 2020-09-27 MED ORDER — DIPHENHYDRAMINE HCL 25 MG PO CAPS: 25.0000 mg | ORAL_CAPSULE | Freq: Four times a day (QID) | ORAL | Status: DC | PRN

## 2020-09-27 MED ORDER — EPHEDRINE 5 MG/ML INJ: 10.0000 mg | INTRAVENOUS | Status: DC | PRN

## 2020-09-27 MED ORDER — TRANEXAMIC ACID-NACL 1000-0.7 MG/100ML-% IV SOLN
1000.0000 mg | Freq: Once | INTRAVENOUS | Status: AC
  Administered 2020-09-27: 1000 mg via INTRAVENOUS

## 2020-09-27 MED ORDER — DIPHENHYDRAMINE HCL 50 MG/ML IJ SOLN: 12.5000 mg | INTRAMUSCULAR | Status: DC | PRN

## 2020-09-27 MED ORDER — BENZOCAINE-MENTHOL 20-0.5 % EX AERO
1.0000 "application " | INHALATION_SPRAY | CUTANEOUS | Status: DC | PRN
  Administered 2020-09-29: 1 via TOPICAL
  Filled 2020-09-27 (×2): qty 56

## 2020-09-27 MED ORDER — PRENATAL MULTIVITAMIN CH
1.0000 | ORAL_TABLET | Freq: Every day | ORAL | Status: DC
  Administered 2020-09-28 – 2020-09-29 (×2): 1 via ORAL
  Filled 2020-09-27 (×2): qty 1

## 2020-09-27 MED ORDER — LIDOCAINE HCL (PF) 1 % IJ SOLN
INTRAMUSCULAR | Status: DC | PRN
  Administered 2020-09-27: 8 mL via EPIDURAL

## 2020-09-27 MED ORDER — DIBUCAINE (PERIANAL) 1 % EX OINT
1.0000 | TOPICAL_OINTMENT | CUTANEOUS | Status: DC | PRN
Start: 2020-09-27 — End: 2020-09-29

## 2020-09-27 NOTE — Anesthesia Procedure Notes (Signed)
Epidural Patient location during procedure: OB Start time: 09/27/2020 5:35 AM End time: 09/27/2020 5:45 AM  Staffing Anesthesiologist: Mellody Dance, MD Performed: anesthesiologist   Preanesthetic Checklist Completed: patient identified, IV checked, site marked, risks and benefits discussed, monitors and equipment checked, pre-op evaluation and timeout performed  Epidural Patient position: sitting Prep: DuraPrep Patient monitoring: heart rate, cardiac monitor, continuous pulse ox and blood pressure Approach: midline Location: L3-L4 Injection technique: LOR saline  Needle:  Needle type: Tuohy  Needle gauge: 17 G Needle length: 9 cm Needle insertion depth: 5.5 cm Catheter type: closed end flexible Catheter size: 20 Guage Catheter at skin depth: 10.5 cm Test dose: negative and Other  Assessment Events: blood not aspirated, injection not painful, no injection resistance and negative IV test  Additional Notes Informed consent obtained prior to proceeding including risk of failure, 1% risk of PDPH, risk of minor discomfort and bruising.  Discussed rare but serious complications including epidural abscess, permanent nerve injury, epidural hematoma.  Discussed alternatives to epidural analgesia and patient desires to proceed.  Timeout performed pre-procedure verifying patient name, procedure, and platelet count.  Patient tolerated procedure well.

## 2020-09-27 NOTE — Lactation Note (Signed)
This note was copied from a baby's chart. Lactation Consultation Note  Patient Name: Meagan Martin [id number] Date: 09/27/2020 Reason for consult: L&D Initial assessment Age:21 hours  LC to L&D for assistance with first bf. Placed baby sts and observed breast crawl. Baby was quiet alert. Baby licked and nuzzled at breast but was in no hurry to bf. Assisted a couple of time with achieving latch but baby did not maintain seal. Left mom and baby happily sts. Reviewed feeding cues with mom and encouraged cue-based feeding. Will plan f/u on MBU. Feeding Mother's Current Feeding Choice: Breast Milk  LATCH Score Latch: Repeated attempts needed to sustain latch, nipple held in mouth throughout feeding, stimulation needed to elicit sucking reflex.  Audible Swallowing: None  Type of Nipple: Everted at rest and after stimulation  Comfort (Breast/Nipple): Soft / non-tender  Hold (Positioning): Assistance needed to correctly position infant at breast and maintain latch.  LATCH Score: 6   Interventions Interventions: Education   Consult Status Consult Status: Follow-up Follow-up type: In-patient    Elder Negus 09/27/2020, 6:37 PM

## 2020-09-27 NOTE — Progress Notes (Addendum)
Labor Progress Note Meagan Martin is a 20 y.o. G1P0 at [redacted]w[redacted]d presented for IOL for PEC w/o SF.  S:  Patient having pain with contractions despite epidural.  O:  BP (!) 142/85   Pulse 84   Temp 98.9 F (37.2 C) (Oral)   Resp 16   LMP 12/25/2019   SpO2 99%  EFM: baseline 150, moderate variability, - accelerations, - decelerations  CVE: Dilation: 9 Effacement (%): 90 Cervical Position: Posterior Station: -1 Presentation: Vertex Exam by:: dr Barb Merino   A&P: 20 y.o. G1P0 [redacted]w[redacted]d here for IOL for PEC w/o SF. #Labor: Progressing well. Pitocin started at 1900. SROM 0155. Will continue to up-titrate pitocin as clinically indicated. #Pain: Epidural in place; bolus for breakthrough pain, repositioning #PEC w/o SF: UPC 2.31 on admission. Hgb 9.6 and plts 136. Most recently mild range blood pressures. Will continue to monitor. #FWB: Category 1 #GBS negative  Meagan Kussmaul, MD 2:30 PM  Attestation of Supervision of Resident:  I confirm that I have verified the information documented in the  resident's  note and that I have also personally reperformed the history, physical exam and all medical decision making activities.  I have verified that all services and findings are accurately documented in this student's note; and I agree with management and plan as outlined in the documentation. I have also made any necessary editorial changes.  Sheila Oats, MD [facility], [facility] 09/27/2020 3:27 PM

## 2020-09-27 NOTE — Progress Notes (Signed)
Labor Progress Note Meagan Martin is a 20 y.o. G1P0 at [redacted]w[redacted]d presented for elevated BP, facial swelling, now with diagnosis of PEC w/o SF.   S: Patient feeling contractions lying in bed  O:  BP 123/84   Pulse 83   Temp 97.7 F (36.5 C) (Oral)   Resp 18   LMP 12/25/2019  EFM: 120/mod variability/pos accels/no decels   CVE: Dilation: 4 Effacement (%): 60 Cervical Position: Posterior Station: -2 Presentation: Vertex Exam by:: Orlan Leavens, RN   A&P: 20 y.o. G1P0 [redacted]w[redacted]d here for IOL for PEC w/o SF.   #Labor: s/p cytotec x2, FB (out). Pitocin started at 1900. Continue to uptitrate pitocin.  #PEC w/o SF: based on MR pressures and UPC 2.31. Continues to have MR pressures, no severe. Will continue to monitor.  #gestational thrombocytopenia: platelets stable  #Pain: pain meds, epidural prn  #FWB: cat I  #GBS negative   Tilden Dome, MD 1:39 AM

## 2020-09-27 NOTE — Anesthesia Preprocedure Evaluation (Signed)
Anesthesia Evaluation  Patient identified by MRN, date of birth, ID band Patient awake    Reviewed: Allergy & Precautions, NPO status , Patient's Chart, lab work & pertinent test results  Airway Mallampati: II  TM Distance: >3 FB Neck ROM: Full    Dental no notable dental hx.    Pulmonary neg pulmonary ROS,    Pulmonary exam normal breath sounds clear to auscultation       Cardiovascular negative cardio ROS Normal cardiovascular exam Rhythm:Regular Rate:Normal     Neuro/Psych negative neurological ROS  negative psych ROS   GI/Hepatic negative GI ROS, Neg liver ROS,   Endo/Other  negative endocrine ROS  Renal/GU negative Renal ROS  negative genitourinary   Musculoskeletal negative musculoskeletal ROS (+)   Abdominal   Peds negative pediatric ROS (+)  Hematology negative hematology ROS (+)   Anesthesia Other Findings   Reproductive/Obstetrics negative OB ROS                             Anesthesia Physical Anesthesia Plan  ASA: II  Anesthesia Plan: Epidural   Post-op Pain Management:    Induction:   PONV Risk Score and Plan: 2  Airway Management Planned:   Additional Equipment: None  Intra-op Plan:   Post-operative Plan:   Informed Consent: I have reviewed the patients History and Physical, chart, labs and discussed the procedure including the risks, benefits and alternatives for the proposed anesthesia with the patient or authorized representative who has indicated his/her understanding and acceptance.       Plan Discussed with: Anesthesiologist  Anesthesia Plan Comments:         Anesthesia Quick Evaluation

## 2020-09-27 NOTE — Progress Notes (Signed)
Labor Progress Note Meagan Martin is a 20 y.o. G1P0 at [redacted]w[redacted]d presented for elevated BP, facial swelling, now with diagnosis of PEC w/o SF.   S: Patient very uncomfortable   O:  BP 130/80   Pulse 95   Temp 97.7 F (36.5 C) (Oral)   Resp 18   LMP 12/25/2019  EFM: 120/mod variability/pos accels/occasional variable decels   CVE: Dilation: 4 Effacement (%): 60 Cervical Position: Posterior Station: -2 Presentation: Vertex Exam by:: Dr. Myriam Jacobson   A&P: 20 y.o. G1P0 [redacted]w[redacted]d here for IOL for PEC w/o SF.   #Labor: s/p cytotec x2, FB (out). Pitocin started at 1900. SROM 0155. IUPC placed at this time given minimal cervical change. Continue to titrate pitocin.   #PEC w/o SF: based on MR pressures and UPC 2.31. Continues to have MR pressures, no severe. Will continue to monitor.  #gestational thrombocytopenia: platelets stable  #Pain: pain meds, epidural prn  #FWB: cat I  #GBS negative   Gita Kudo, MD 4:21 AM

## 2020-09-28 ENCOUNTER — Other Ambulatory Visit: Payer: Self-pay | Admitting: Student

## 2020-09-28 LAB — CBC
HCT: 23.7 % — ABNORMAL LOW (ref 36.0–46.0)
Hemoglobin: 7.7 g/dL — ABNORMAL LOW (ref 12.0–15.0)
MCH: 28.8 pg (ref 26.0–34.0)
MCHC: 32.5 g/dL (ref 30.0–36.0)
MCV: 88.8 fL (ref 80.0–100.0)
Platelets: 105 10*3/uL — ABNORMAL LOW (ref 150–400)
RBC: 2.67 MIL/uL — ABNORMAL LOW (ref 3.87–5.11)
RDW: 14.4 % (ref 11.5–15.5)
WBC: 10.6 10*3/uL — ABNORMAL HIGH (ref 4.0–10.5)
nRBC: 0 % (ref 0.0–0.2)

## 2020-09-28 MED ORDER — AMLODIPINE BESYLATE 5 MG PO TABS
5.0000 mg | ORAL_TABLET | Freq: Every day | ORAL | Status: DC
  Administered 2020-09-28 – 2020-09-29 (×2): 5 mg via ORAL
  Filled 2020-09-28 (×2): qty 1

## 2020-09-28 MED ORDER — SODIUM CHLORIDE 0.9 % IV SOLN
510.0000 mg | Freq: Once | INTRAVENOUS | Status: AC
  Administered 2020-09-28: 510 mg via INTRAVENOUS
  Filled 2020-09-28: qty 17

## 2020-09-28 MED ORDER — RHO D IMMUNE GLOBULIN 1500 UNIT/2ML IJ SOSY
300.0000 ug | PREFILLED_SYRINGE | Freq: Once | INTRAMUSCULAR | Status: AC
  Administered 2020-09-28: 300 ug via INTRAVENOUS
  Filled 2020-09-28: qty 2

## 2020-09-28 NOTE — Lactation Note (Signed)
This note was copied from a baby's chart. Lactation Consultation Note  Patient Name: Meagan Martin [id number] Date: 09/28/2020 Reason for consult: Initial assessment;Other (Comment) (infant was given 15 mls of formula due low blood sugar and mom declining donor breast milk.) Age:22 hours  Mom's current feeding choice is breast and formula feeding. P1, term female infant was transported to [facility] due to repeated  low temps and mom has been doing STS with infant.  LC entered the room, mom was attempting to latch infant at the breast. Per mom, infant has not latch and been very sleepy with low body temps. LC reviewed hand expression and infant was given 2 mls of colostrum by spoon, mom was pleased she thought she did not have any EBM. Mom latched infant on her right breast using the football hold position, infant latched for 4 minutes , LC was informed prior to [facility] entering the room, infant was given 15 mls of formula. Mom will continue to work on latching infant at the breast and will ask RN or lC for latch assistance if needed. Mom will breastfeed infant according to cues, 8 to 12 or more times within 24 hours, STS. Mom will always latch infant at the breast first before supplementing with formula.  LC discussed infant's input and output with mom. Mom made aware of O/P services, breastfeeding support groups, community resources, and our phone # for post-discharge questions.  Maternal Data Has patient been taught Hand Expression?: Yes Does the patient have breastfeeding experience prior to this delivery?: No  Feeding Mother's Current Feeding Choice: Breast Milk and Formula Nipple Type: Slow - flow  LATCH Score Latch: Grasps breast easily, tongue down, lips flanged, rhythmical sucking.  Audible Swallowing: A few with stimulation  Type of Nipple: Everted at rest and after stimulation  Comfort (Breast/Nipple): Soft / non-tender  Hold (Positioning): Assistance needed to  correctly position infant at breast and maintain latch.  LATCH Score: 8   Lactation Tools Discussed/Used    Interventions Interventions: Breast feeding basics reviewed;Assisted with latch;Skin to skin;Breast massage;Hand express;Breast compression;Adjust position;Support pillows;Position options;Expressed milk;Hand pump;Education  Discharge Pump: Manual WIC Program: Yes  Consult Status Consult Status: Follow-up Date: 09/28/20 Follow-up type: In-patient    Danelle Earthly 09/28/2020, 2:06 AM

## 2020-09-28 NOTE — Progress Notes (Signed)
Post Partum Day # 1 Subjective: no complaints and up ad lib; she denies headache, blurry vision, floating spots, overall edema or RUQ pain   Objective: Blood pressure 108/88, pulse 84, temperature 98.1 F (36.7 C), temperature source Oral, resp. rate 19, last menstrual period 12/25/2019, SpO2 100 %, unknown if currently breastfeeding.  Physical Exam:  General: alert, cooperative and no distress Lochia: appropriate Uterine Fundus: firm Incision: NA DVT Evaluation: no evidence of DVT seen on exam  Recent Labs    09/27/20 1746 09/28/20 0531  HGB 9.2* 7.7*  HCT 28.8* 23.7*   -CMET from yesterday was normal, CBC this morning shows drop to 7.7 so will give one dose of Feraheme. EBL per Delivery note says EBL was 175 Assessment/Plan: Plan for discharge tomorrow  -patient Hgb 7.7; will give IV dose of feraheme   LOS: 2 days   Marylene Land 09/28/2020, 10:38 AM

## 2020-09-28 NOTE — Anesthesia Postprocedure Evaluation (Signed)
Anesthesia Post Note  Patient: Energy manager  Procedure(s) Performed: AN AD HOC LABOR EPIDURAL     Patient location during evaluation: Mother Baby Anesthesia Type: Epidural Level of consciousness: awake and alert, oriented and patient cooperative Pain management: pain level controlled Vital Signs Assessment: post-procedure vital signs reviewed and stable Respiratory status: spontaneous breathing Cardiovascular status: stable Postop Assessment: no headache, epidural receding, patient able to bend at knees and no signs of nausea or vomiting Anesthetic complications: no Comments: Pt. States she is walking.  Pain score 0.    No complications documented.  Last Vitals:  Vitals:   09/28/20 0131 09/28/20 0524  BP: 118/77 108/88  Pulse: 93 84  Resp: 19 19  Temp:  36.7 C  SpO2: 100% 100%    Last Pain:  Vitals:   09/28/20 0524  TempSrc: Oral  PainSc:    Pain Goal:                   [facility]

## 2020-09-28 NOTE — Lactation Note (Addendum)
This note was copied from a baby's chart. Lactation Consultation Note  Patient Name: Girl Chrissi Crow [id number] Date: 09/28/2020 Reason for consult: Follow-up assessment;Difficult latch Age:22 hours  P1, RN request due to infant having difficulty sustaining latch. Had mother prepump with hand pump.  Small drops of colostrum visible.  Baby latches and then tongue thrusts off breast. Breastfed in football hold on R breast and cross cradle while laid back on L breast, needing good head support to sustain latch.  After repeated attempts baby was able to sustain latch for total of 20 min. R side more challenging than L side. Mother is supplementing with formula after.  Discussed paced feeding and reviewed volume guidelines.    Feeding Mother's Current Feeding Choice: Breast Milk and Formula  LATCH Score Latch: Repeated attempts needed to sustain latch, nipple held in mouth throughout feeding, stimulation needed to elicit sucking reflex.  Audible Swallowing: A few with stimulation  Type of Nipple: Everted at rest and after stimulation  Comfort (Breast/Nipple): Soft / non-tender  Hold (Positioning): Assistance needed to correctly position infant at breast and maintain latch.  LATCH Score: 7   Interventions Interventions: Breast feeding basics reviewed;Assisted with latch;Skin to skin;Breast massage;Hand express;Pre-pump if needed;Breast compression;Adjust position;Support pillows;Position options;Education    Consult Status Consult Status: Follow-up Date: 09/29/20 Follow-up type: In-patient   Dahlia Byes [facility] 09/28/2020, 11:38 AM

## 2020-09-29 DIAGNOSIS — O1400 Mild to moderate pre-eclampsia, unspecified trimester: Secondary | ICD-10-CM | POA: Diagnosis present

## 2020-09-29 DIAGNOSIS — D509 Iron deficiency anemia, unspecified: Secondary | ICD-10-CM

## 2020-09-29 HISTORY — DX: Mild to moderate pre-eclampsia, unspecified trimester: O14.00

## 2020-09-29 LAB — RH IG WORKUP (INCLUDES ABO/RH)
ABO/RH(D): O NEG
Fetal Screen: NEGATIVE
Gestational Age(Wks): 39
Unit division: 0

## 2020-09-29 MED ORDER — POLYETHYLENE GLYCOL 3350 17 GM/SCOOP PO POWD: 17.0000 g | Freq: Every day | ORAL | 0 refills | Status: DC | PRN

## 2020-09-29 MED ORDER — IBUPROFEN 600 MG PO TABS: 600.0000 mg | ORAL_TABLET | Freq: Four times a day (QID) | ORAL | 0 refills | Status: DC

## 2020-09-29 MED ORDER — COCONUT OIL OIL: 1.0000 "application " | TOPICAL_OIL | 0 refills | Status: DC | PRN

## 2020-09-29 MED ORDER — MEDROXYPROGESTERONE ACETATE 150 MG/ML IM SUSP
150.0000 mg | Freq: Once | INTRAMUSCULAR | Status: AC
  Administered 2020-09-29: 150 mg via INTRAMUSCULAR
  Filled 2020-09-29: qty 1

## 2020-09-29 MED ORDER — AMLODIPINE BESYLATE 5 MG PO TABS: 5.0000 mg | ORAL_TABLET | Freq: Every day | ORAL | 0 refills | Status: DC

## 2020-09-29 MED ORDER — DOCUSATE SODIUM 100 MG PO CAPS: 100.0000 mg | ORAL_CAPSULE | Freq: Two times a day (BID) | ORAL | 2 refills | Status: DC

## 2020-09-29 MED ORDER — ACETAMINOPHEN 325 MG PO TABS: 650.0000 mg | ORAL_TABLET | Freq: Four times a day (QID) | ORAL | 0 refills | Status: DC | PRN

## 2020-09-29 MED ORDER — FERROUS SULFATE 325 (65 FE) MG PO TABS: 325.0000 mg | ORAL_TABLET | ORAL | 3 refills | Status: DC

## 2020-09-29 NOTE — Lactation Note (Signed)
This note was copied from a baby's chart. Lactation Consultation Note  Patient Name: Meagan Martin Date: 09/29/2020 Reason for consult: Follow-up assessment Age:22 hours   Mother reports that she just fed infant 20 ml of formula.  She will page [facility] when ready for next breastfeeding.  Mother reports that she would like to try to latch infant with a NS. Mother also reports that her insurance doesn't provide an electric pump. She would like to get a pump from [facility]. WIC referral forms filled out to fax Mother reports now that her sister has a Medela pump that she can have. 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   Discussed treatment and prevention of engorgement.  Mother pumped her breast for 15 mins on each side with hand pump. She obtained multiple drops and gave to infant with her finger.   Plan of Care : Breastfeed infant with feeding cues Supplement infant with ebm/formula, according to supplemental guidelines. Pump using a DEBP after each feeding for 15-20 mins.   Mother to continue to cue base feed infant and feed at least 8-12 times or more in 24 hours and advised to allow for cluster feeding infant as needed.  Mother to continue to due STS. Mother is aware of available LC services at [facility], BFSG'S, OP Dept, and phone # for questions or concerns about breastfeeding.  Mother receptive to all teaching and plan of care.    Maternal Data    Feeding Mother's Current Feeding Choice: Breast Milk Nipple Type: Slow - flow  LATCH Score                    Lactation Tools Discussed/Used    Interventions Interventions: Hand express;Hand pump  Discharge Pump: Manual WIC Program: Yes  Consult Status Consult Status: Follow-up Date: 09/29/20 Follow-up type: In-patient    Stevan Born [facility] 09/29/2020, 10:00 AM

## 2020-09-29 NOTE — Discharge Instructions (Signed)
Preeclampsia and Eclampsia Preeclampsia is a serious condition that may develop during pregnancy. This condition involves high blood pressure during pregnancy and causes symptoms such as headaches, vision changes, and increased swelling in the legs, hands, and face. Preeclampsia occurs after 20 weeks of pregnancy. Eclampsia is a seizure that happens from worsening preeclampsia. Diagnosing and managing preeclampsia early is important. If not treated early, it can cause serious problems for mother and baby. There is no cure for this condition. However, during pregnancy, delivering the baby may be the best treatment for preeclampsia or eclampsia. For most women, symptoms of preeclampsia and eclampsia go away after giving birth. In rare cases, a woman may develop preeclampsia or eclampsia after giving birth. This usually occurs within 48 hours after childbirth but may occur up to 6 weeks after giving birth. What are the causes? The cause of this condition is not known. What increases the risk? The following factors make you more likely to develop preeclampsia:  Being pregnant for the first time or being pregnant with multiples.  Having had preeclampsia or a condition called hemolysis, elevated liver enzymes, and low platelet count (HELLP)syndrome during a past pregnancy.  Having a family history of preeclampsia.  Being older than age 35.  Being obese.  Becoming pregnant through fertility treatments. Conditions that reduce blood flow or oxygen to your placenta and baby may also increase your risk. These include:  High blood pressure before, during, or immediately following pregnancy.  Kidney disease.  Diabetes.  Blood clotting disorders.  Autoimmune diseases, such as lupus.  Sleep apnea. What are the signs or symptoms? Common symptoms of this condition include:  A severe, throbbing headache that does not go away.  Vision problems, such as blurred or double vision and light  sensitivity.  Pain in the stomach, especially the right upper region.  Pain in the shoulder. Other symptoms that may develop as the condition gets worse include:  Sudden weight gain because of fluid buildup in the body. This causes swelling of the face, hands, legs, and feet.  Severe nausea and vomiting.  Urinating less than usual.  Shortness of breath.  Seizures. How is this diagnosed? Your health care provider will ask you about symptoms and check for signs of preeclampsia during your prenatal visits. You will also have routine tests, including:  Checking your blood pressure.  Urine tests to check for protein.  Blood tests to assess your organ function.  Monitoring your baby's heart rate.  Ultrasounds to check fetal growth.   How is this treated? You and your health care provider will determine the treatment that is best for you. Treatment may include:  Frequent prenatal visits to check for preeclampsia.  Medicine to lower your blood pressure.  Medicine to prevent seizures.  Low-dose aspirin during your pregnancy.  Staying in the hospital, in severe cases. You will be given medicines to control your blood pressure and the amount of fluids in your body.  Delivering your baby. Work with your health care provider to manage any chronic health conditions, such as diabetes or kidney problems. Also, work with your health care provider to manage weight gain during pregnancy. Follow these instructions at home: Eating and drinking  Drink enough fluid to keep your urine pale yellow.  Avoid caffeine. Caffeine may increase blood pressure and heart rate and lead to dehydration.  Reduce the amount of salt that you eat. Lifestyle  Do not use any products that contain nicotine or tobacco. These products include cigarettes, chewing tobacco, and   vaping devices, such as e-cigarettes. If you need help quitting, ask your health care provider.  Do not use alcohol or drugs.  Avoid  stress as much as possible.  Rest and get plenty of sleep. General instructions  Take over-the-counter and prescription medicines only as told by your health care provider.  When lying down, lie on your left side. This keeps pressure off your major blood vessels.  When sitting or lying down, raise (elevate) your feet. Try putting pillows underneath your lower legs.  Exercise regularly. Ask your health care provider what kinds of exercise are best for you.  Check your blood pressure as often as recommended by your health care provider.  Keep all prenatal and follow-up visits. This is important.   Contact a health care provider if:  You have symptoms that may need treatment or closer monitoring. These include: ? Headaches. ? Stomach pain or nausea and vomiting. ? Shoulder pain. ? Vision problems, such as spots in front of your eyes or blurry vision. ? Sudden weight gain or increased swelling in your face, hands, legs, and feet. ? Increased anxiety or feeling of impending doom. ? Signs or symptoms of labor. Get help right away if:  You have any of the following symptoms: ? A seizure. ? Shortness of breath or trouble breathing. ? Trouble speaking or slurred speech. ? Fainting. ? Chest pain. These symptoms may represent a serious problem that is an emergency. Do not wait to see if the symptoms will go away. Get medical help right away. Call your local emergency services (911 in the [place] Do not drive yourself to the hospital. Summary  Preeclampsia is a serious condition that may develop during pregnancy.  Diagnosing and treating preeclampsia early is very important.  Keep all prenatal and follow-up visits. This is important.  Get help right away if you have a seizure, shortness of breath or trouble breathing, trouble speaking or slurred speech, chest pain, or fainting. This information is not intended to replace advice given to you by your health care provider. Make sure you  discuss any questions you have with your health care provider. Document Revised: 03/07/2020 Document Reviewed: 03/07/2020 Elsevier Patient Education  2021 Elsevier Inc.  

## 2020-09-29 NOTE — Lactation Note (Signed)
This note was copied from a baby's chart. Lactation Consultation Note  Patient Name: Meagan Martin [id number] Date: 09/29/2020 Reason for consult: Follow-up assessment Age:22 hours   Page back to mothers room to assist with latch. Staff nurse had assist mother in placing nipple shield on mothers left breast. Infant in football hold. Adjust infants latch.  Taught mother how to roll top lip up and do chin tug. Infants lips wider when I began to give formula with a curved tip syringe thur the nipple shield. Infant was given 24 mls of formula.  Infant was latched on mother bare breast and compressed to nipple in a long gated position. Assessed infants tongue and observed that infant has a tight short posterior lingual frenulum.infants tongue cups and has a crease in the tip . Mother was given Specialist name.   Discussed treatment and prevention of engorgement.  Plan of Care : Breastfeed infant with feeding cues Supplement infant with ebm/formula, according to supplemental guidelines. Pump using a DEBP after each feeding for 15-20 mins.   Mother to continue to cue base feed infant and feed at least 8-12 times or more in 24 hours and advised to allow for cluster feeding infant as needed.  Mother to continue to due STS. Mother is aware of available LC services at [facility], BFSG'S, OP Dept, and phone # for questions or concerns about breastfeeding.  Mother receptive to all teaching and plan of care.    Maternal Data    Feeding Mother's Current Feeding Choice: Breast Milk  LATCH Score                    Lactation Tools Discussed/Used Tools: Nipple Shields Nipple shield size: 20  Interventions    Discharge Discharge Education: Engorgement and breast care;Warning signs for feeding baby;Outpatient recommendation  Consult Status Consult Status: Complete    Michel Bickers 09/29/2020, 1:02 PM

## 2020-09-29 NOTE — Discharge Summary (Signed)
Postpartum Discharge Summary       Patient Name: Meagan Martin DOB: 15-Apr-2001 MRN: [id number]  Date of admission: 09/26/2020 Delivery date:09/27/2020  Delivering provider: Randa Ngo  Date of discharge: 09/29/2020  Admitting diagnosis: Excessive weight gain affecting pregnancy [O26.00]  Intrauterine pregnancy: [redacted]w[redacted]d    Secondary diagnosis:  Principal Problem:   Vaginal delivery Active Problems:   Mild preeclampsia   Iron deficiency anemia  Additional problems: none    Discharge diagnosis: Term Pregnancy Delivered, Preeclampsia (mild) and Anemia                                              Post partum procedures:feraheme, depo Augmentation: AROM, Pitocin, Cytotec and IP Foley Complications: None  Hospital course: Induction of Labor With Vaginal Delivery   20y.o. yo G1P1001 at 312w1das admitted to the hospital 09/26/2020 for induction of labor.  Indication for induction: preeclampsia.  Patient had an uncomplicated labor course as follows: Membrane Rupture Time/Date: 1:55 AM ,09/27/2020   Delivery Method:Vaginal, Spontaneous  Episiotomy: None  Lacerations:  2nd degree;Periurethral  Details of delivery can be found in separate delivery note.  Patient had a routine postpartum course. Patient is discharged home 09/29/20.  Newborn Data: Birth date:09/27/2020  Birth time:5:06 PM  Gender:Female  Living status:Living  Apgars:8 ,9  Weight:2980 g   Magnesium Sulfate received: No BMZ received: No Rhophylac:N/A MMR:N/A T-DaP:Given prenatally Flu: Yes Transfusion:No  Physical exam  Vitals:   09/28/20 1452 09/28/20 1712 09/28/20 2045 09/29/20 0610  BP: 107/76 121/82 122/90 102/72  Pulse: 91 90 88 79  Resp: _0 Temp: 98.2 F (36.8 C)  98.1 F (36.7 C) 97.9 F (36.6 C)  TempSrc: Oral  Oral Oral  SpO2: 98% 100% 100% 100%    General: alert, cooperative and no distress Lochia: appropriate Uterine Fundus: firm Incision: N/A DVT Evaluation: No  evidence of DVT seen on physical exam. Labs: Lab Results  Component Value Date   WBC 10.6 (H) 09/28/2020   HGB 7.7 (L) 09/28/2020   HCT 23.7 (L) 09/28/2020   MCV 88.8 09/28/2020   PLT 105 (L) 09/28/2020   CMP Latest Ref Rng & Units 09/27/2020  Glucose 70 - 99 mg/dL 84  BUN 6 - 20 mg/dL <5(L)  Creatinine 0.44 - 1.00 mg/dL 0.55  Sodium 135 - 145 mmol/L 135  Potassium 3.5 - 5.1 mmol/L 3.8  Chloride 98 - 111 mmol/L 107  CO2 22 - 32 mmol/L 22  Calcium 8.9 - 10.3 mg/dL 8.0(L)  Total Protein 6.5 - 8.1 g/dL 5.0(L)  Total Bilirubin 0.3 - 1.2 mg/dL 0.6  Alkaline Phos 38 - 126 U/L 160(H)  AST 15 - 41 U/L 18  ALT 0 - 44 U/L 14   Edinburgh Score: Edinburgh Postnatal Depression Scale Screening Tool 09/28/2020  I have been able to laugh and see the funny side of things. 0  I have looked forward with enjoyment to things. 0  I have blamed myself unnecessarily when things went wrong. 1  I have been anxious or worried for no good reason. 0  I have felt scared or panicky for no good reason. 2  Things have been getting on top of me. 1  I have been so unhappy that I have had difficulty sleeping. 0  I have felt sad or miserable. 0  I have  been so unhappy that I have been crying. 0  The thought of harming myself has occurred to me. 0  Edinburgh Postnatal Depression Scale Total 4     After visit meds:  Allergies as of 09/29/2020   No Known Allergies     Medication List    TAKE these medications   acetaminophen 325 MG tablet Commonly known as: Tylenol Take 2 tablets (650 mg total) by mouth every 6 (six) hours as needed for moderate pain.   amLODipine 5 MG tablet Commonly known as: NORVASC Take 1 tablet (5 mg total) by mouth daily.   calcium carbonate 500 MG chewable tablet Commonly known as: TUMS - dosed in mg elemental calcium Chew 1 tablet by mouth daily as needed for indigestion or heartburn.   coconut oil Oil Apply 1 application topically as needed.   docusate sodium 100 MG  capsule Commonly known as: Colace Take 1 capsule (100 mg total) by mouth 2 (two) times daily.   ferrous sulfate 325 (65 FE) MG tablet Take 1 tablet (325 mg total) by mouth every other day.   ibuprofen 600 MG tablet Commonly known as: ADVIL Take 1 tablet (600 mg total) by mouth every 6 (six) hours.   polyethylene glycol powder 17 GM/SCOOP powder Commonly known as: MiraLax Take 17 g by mouth daily as needed for moderate constipation.   prenatal multivitamin Tabs tablet Take 1 tablet by mouth daily at 12 noon.        Discharge home in stable condition Infant Feeding: Bottle and Breast Infant Disposition:home with mother Discharge instruction: per After Visit Summary and Postpartum booklet. Activity: Advance as tolerated. Pelvic rest for 6 weeks.  Diet: routine diet Future Appointments: Future Appointments  Date Time Provider [place]  10/29/2020 10:15 AM Seabron Spates, CNM [facility] None   Follow up Visit:   Please schedule this patient for a In person postpartum visit in 6 weeks with the following provider: Any provider. Additional Postpartum F/U:BP check 1 week  High risk pregnancy complicated by: preeclampsia Delivery mode:  Vaginal, Spontaneous  Anticipated Birth Control:  Depo   09/29/2020 Janet Berlin, MD

## 2020-09-30 ENCOUNTER — Encounter (HOSPITAL_COMMUNITY): Payer: Self-pay | Admitting: Family Medicine

## 2020-09-30 ENCOUNTER — Other Ambulatory Visit: Payer: Self-pay

## 2020-09-30 ENCOUNTER — Inpatient Hospital Stay (HOSPITAL_COMMUNITY)
Admission: AD | Admit: 2020-09-30 | Discharge: 2020-09-30 | Disposition: A | Discharge status: Home / Self Care | Payer: 59 | Attending: Family Medicine | Admitting: Family Medicine

## 2020-09-30 DIAGNOSIS — R600 Localized edema: Secondary | ICD-10-CM | POA: Diagnosis not present

## 2020-09-30 DIAGNOSIS — O9089 Other complications of the puerperium, not elsewhere classified: Secondary | ICD-10-CM | POA: Diagnosis present

## 2020-09-30 DIAGNOSIS — O99892 Other specified diseases and conditions complicating childbirth: Secondary | ICD-10-CM

## 2020-09-30 DIAGNOSIS — O1495 Unspecified pre-eclampsia, complicating the puerperium: Secondary | ICD-10-CM | POA: Diagnosis not present

## 2020-09-30 DIAGNOSIS — Z79899 Other long term (current) drug therapy: Secondary | ICD-10-CM | POA: Insufficient documentation

## 2020-09-30 DIAGNOSIS — O165 Unspecified maternal hypertension, complicating the puerperium: Secondary | ICD-10-CM

## 2020-09-30 LAB — CBC
HCT: 29.3 % — ABNORMAL LOW (ref 36.0–46.0)
Hemoglobin: 9.3 g/dL — ABNORMAL LOW (ref 12.0–15.0)
MCH: 29.1 pg (ref 26.0–34.0)
MCHC: 31.7 g/dL (ref 30.0–36.0)
MCV: 91.6 fL (ref 80.0–100.0)
Platelets: 179 10*3/uL (ref 150–400)
RBC: 3.2 MIL/uL — ABNORMAL LOW (ref 3.87–5.11)
RDW: 15 % (ref 11.5–15.5)
WBC: 11.8 10*3/uL — ABNORMAL HIGH (ref 4.0–10.5)
nRBC: 0.2 % (ref 0.0–0.2)

## 2020-09-30 LAB — COMPREHENSIVE METABOLIC PANEL
ALT: 27 U/L (ref 0–44)
AST: 33 U/L (ref 15–41)
Albumin: 3 g/dL — ABNORMAL LOW (ref 3.5–5.0)
Alkaline Phosphatase: 144 U/L — ABNORMAL HIGH (ref 38–126)
Anion gap: 8 (ref 5–15)
BUN: 14 mg/dL (ref 6–20)
CO2: 23 mmol/L (ref 22–32)
Calcium: 8.8 mg/dL — ABNORMAL LOW (ref 8.9–10.3)
Chloride: 104 mmol/L (ref 98–111)
Creatinine, Ser: 0.72 mg/dL (ref 0.44–1.00)
GFR, Estimated: >60 mL/min (ref >60)
Glucose, Bld: 86 mg/dL (ref 70–99)
Potassium: 4 mmol/L (ref 3.5–5.1)
Sodium: 135 mmol/L (ref 135–145)
Total Bilirubin: 0.7 mg/dL (ref 0.3–1.2)
Total Protein: 6.9 g/dL (ref 6.5–8.1)

## 2020-09-30 MED ORDER — IBUPROFEN 800 MG PO TABS
800.0000 mg | ORAL_TABLET | Freq: Once | ORAL | Status: AC
  Administered 2020-09-30: 800 mg via ORAL
  Filled 2020-09-30: qty 1

## 2020-09-30 MED ORDER — ACETAMINOPHEN 500 MG PO TABS
1000.0000 mg | ORAL_TABLET | Freq: Once | ORAL | Status: AC
  Administered 2020-09-30: 1000 mg via ORAL
  Filled 2020-09-30: qty 2

## 2020-09-30 NOTE — MAU Note (Signed)
PT SAYS SHE DEL VAG ON Friday 09-27-2020.. WENT HOME YESTERDAY   AT 5PM- BP WAS 134/90- BC LEFT LEG FEELS WEIRD- SWOLLEN - MORE THAN RIGHT .  IN WAITING ROOM - SAW FLOATERS.  SENT HOME ON MEDS - AM BP - SHE TOOK THIS AM .  HAS MILD H/A- STARTED - IN WAITING ROOM- 2/10.   DENIES EPIGASTRIC PAIN

## 2020-09-30 NOTE — MAU Provider Note (Signed)
History     CSN: [id number]  Arrival date and time: 09/30/20 1933   Event Date/Time   First Provider Initiated Contact with Patient 09/30/20 2112      Chief Complaint  Patient presents with  . Edema   HPI Meagan Martin is a 20 y.o. G1P1001 postpartum from a vaginal delivery on 4/1 who presents with left lower leg swelling. She feels like it is worse than it has been. She denies any pain, redness or heat over the area. She reports a headache that she rates a 3/10 and has not tried any medication for. She denies any visual changes or epigastric pain. She was diagnosed with preeclampsia without severe features while in labor and did not receive magnesium. She is taking 5mg  norvasc for her blood pressure  OB History as of 09/29/2020    Gravida  1   Para  1   Term  1   Preterm      AB      Living  1     SAB      IAB      Ectopic      Multiple  0   Live Births  1           Past Medical History:  Diagnosis Date  . Medical history non-contributory     Past Surgical History:  Procedure Laterality Date  . NO PAST SURGERIES      Family History  Problem Relation Age of Onset  . Diabetes Mother   . Diabetes Father   . Cancer Maternal Grandmother        throat  . Intellectual disability Neg Hx   . Hypertension Neg Hx   . Stroke Neg Hx     Social History   Tobacco Use  . Smoking status: Never Smoker  . Smokeless tobacco: Never Used  Vaping Use  . Vaping Use: Never used  Substance Use Topics  . Alcohol use: Never  . Drug use: Never    Allergies: No Known Allergies  Medications Prior to Admission  Medication Sig Dispense Refill Last Dose  . acetaminophen (TYLENOL) 325 MG tablet Take 2 tablets (650 mg total) by mouth every 6 (six) hours as needed for moderate pain. 120 tablet 0 09/30/2020 at Unknown time  . amLODipine (NORVASC) 5 MG tablet Take 1 tablet (5 mg total) by mouth daily. 30 tablet 0 09/30/2020 at Unknown time  . ferrous sulfate 325 (65  FE) MG tablet Take 1 tablet (325 mg total) by mouth every other day. 30 tablet 3 09/30/2020 at Unknown time  . ibuprofen (ADVIL) 600 MG tablet Take 1 tablet (600 mg total) by mouth every 6 (six) hours. 30 tablet 0 09/30/2020 at Unknown time  . polyethylene glycol powder (MIRALAX) 17 GM/SCOOP powder Take 17 g by mouth daily as needed for moderate constipation. 255 g 0 09/30/2020 at Unknown time  . Prenatal Vit-Fe Fumarate-FA (PRENATAL MULTIVITAMIN) TABS tablet Take 1 tablet by mouth daily at 12 noon.   09/30/2020 at Unknown time  . calcium carbonate (TUMS - DOSED IN MG ELEMENTAL CALCIUM) 500 MG chewable tablet Chew 1 tablet by mouth daily as needed for indigestion or heartburn.     . coconut oil OIL Apply 1 application topically as needed. 100 mL 0   . docusate sodium (COLACE) 100 MG capsule Take 1 capsule (100 mg total) by mouth 2 (two) times daily. 30 capsule 2     Review of Systems  Constitutional: Negative.  Negative  for fatigue and fever.  HENT: Negative.   Respiratory: Negative.  Negative for shortness of breath.   Cardiovascular: Negative.  Negative for chest pain.  Gastrointestinal: Negative for abdominal pain, constipation, diarrhea, nausea and vomiting.  Genitourinary: Negative.  Negative for dysuria, vaginal bleeding and vaginal discharge.  Musculoskeletal:       Leg swelling  Neurological: Positive for headaches. Negative for dizziness.   Physical Exam   Blood pressure 128/87, pulse 93, temperature 98.6 F (37 C), temperature source Oral, resp. rate 20, height 5\' 1"  (1.549 m), weight 70.4 kg, last menstrual period 12/25/2019, SpO2 100 %, unknown if currently breastfeeding.  Patient Vitals for the past 24 hrs:  BP Temp Temp src Pulse Resp SpO2 Height Weight  09/30/20 2135 -- -- -- -- -- 100 % -- --  09/30/20 2130 128/87 -- -- 93 -- 100 % -- --  09/30/20 2125 -- -- -- -- -- 100 % -- --  09/30/20 2120 -- -- -- -- -- 100 % -- --  09/30/20 2115 122/84 -- -- 92 -- 100 % -- --  09/30/20  2110 -- -- -- -- -- 100 % -- --  09/30/20 2105 -- -- -- -- -- 100 % -- --  09/30/20 2100 -- -- -- -- -- 100 % -- --  09/30/20 2059 132/89 -- -- 98 -- -- -- --  09/30/20 2022 (!) 139/93 98.6 F (37 C) Oral 100 20 -- 5\' 1"  (1.549 m) 70.4 kg     Physical Exam Vitals and nursing note reviewed.  Constitutional:      General: She is not in acute distress.    Appearance: She is well-developed.  HENT:     Head: Normocephalic.  Eyes:     Pupils: Pupils are equal, round, and reactive to light.  Cardiovascular:     Rate and Rhythm: Normal rate and regular rhythm.     Heart sounds: Normal heart sounds.  Pulmonary:     Effort: Pulmonary effort is normal. No respiratory distress.     Breath sounds: Normal breath sounds.  Abdominal:     General: Bowel sounds are normal. There is no distension.     Palpations: Abdomen is soft.     Tenderness: There is no abdominal tenderness.  Musculoskeletal:     Comments: Minimal bilateral lower extremity swelling. No tenderness or erythema noted  Skin:    General: Skin is warm and dry.  Neurological:     Mental Status: She is alert and oriented to person, place, and time.  Psychiatric:        Behavior: Behavior normal.        Thought Content: Thought content normal.        Judgment: Judgment normal.     MAU Course  Procedures Results for orders placed or performed during the hospital encounter of 09/30/20 (from the past 24 hour(s))  CBC     Status: Abnormal   Collection Time: 09/30/20  8:38 PM  Result Value Ref Range   WBC 11.8 (H) 4.0 - 10.5 K/uL   RBC 3.20 (L) 3.87 - 5.11 MIL/uL   Hemoglobin 9.3 (L) 12.0 - 15.0 g/dL   HCT 11/30/20 (L) 11/30/20 - 25.9 %   MCV 91.6 80.0 - 100.0 fL   MCH 29.1 26.0 - 34.0 pg   MCHC 31.7 30.0 - 36.0 g/dL   RDW 56.3 87.5 - 64.3 %   Platelets 179 150 - 400 K/uL   nRBC 0.2 0.0 - 0.2 %  Comprehensive metabolic  panel     Status: Abnormal   Collection Time: 09/30/20  8:38 PM  Result Value Ref Range   Sodium 135 135 -  145 mmol/L   Potassium 4.0 3.5 - 5.1 mmol/L   Chloride 104 98 - 111 mmol/L   CO2 23 22 - 32 mmol/L   Glucose, Bld 86 70 - 99 mg/dL   BUN 14 6 - 20 mg/dL   Creatinine, Ser 5.36 0.44 - 1.00 mg/dL   Calcium 8.8 (L) 8.9 - 10.3 mg/dL   Total Protein 6.9 6.5 - 8.1 g/dL   Albumin 3.0 (L) 3.5 - 5.0 g/dL   AST 33 15 - 41 U/L   ALT 27 0 - 44 U/L   Alkaline Phosphatase 144 (H) 38 - 126 U/L   Total Bilirubin 0.7 0.3 - 1.2 mg/dL   GFR, Estimated >14 >43 mL/min   Anion gap 8 5 - 15   MDM CBC, CMP Tylenol and ibuprofen PO Reassurance provided of normalcy of exam findings. Encouraged patient to keep BP check appointment  Assessment and Plan   1. Postpartum state   2. Bilateral lower extremity edema   3. Hypertension, postpartum condition or complication    -Discharge home in stable condition -Hypertension precautions discussed -Patient advised to follow-up with OB as scheduled for BP check.  -Patient may return to MAU as needed or if her condition were to change or worsen  Rolm Bookbinder CNM 09/30/2020, 9:47 PM

## 2020-10-07 ENCOUNTER — Ambulatory Visit (INDEPENDENT_AMBULATORY_CARE_PROVIDER_SITE_OTHER): Payer: 59

## 2020-10-07 ENCOUNTER — Other Ambulatory Visit: Payer: Self-pay

## 2020-10-07 VITALS — BP 116/78 | HR 98 | Wt 140.0 lb

## 2020-10-07 DIAGNOSIS — Z0130 Encounter for examination of blood pressure without abnormal findings: Secondary | ICD-10-CM

## 2020-10-07 NOTE — Progress Notes (Addendum)
Subjective:  Meagan Martin is a 20 y.o. female here for BP check.   Hypertension ROS: taking medications as instructed, no medication side effects noted, no TIA's, no chest pain on exertion, no dyspnea on exertion and no swelling of ankles.    Objective:  There were no vitals taken for this visit.  Appearance alert, well appearing, and in no distress. General exam BP noted to be well controlled today in office.    Assessment:   Blood Pressure well controlled.   Plan:  Current treatment plan is effective, no change in therapy.  Pt advised to continue medication and f/u at [facility] visit.   Meagan Martin l Meagan Martin, CMA   Attestation of Attending Supervision of CMA/RN: Evaluation and management procedures were performed by the nurse under my supervision and collaboration.  I have reviewed the nursing note and chart, and I agree with the management and plan.  Meagan Martin, M.D., [facility]

## 2020-10-29 ENCOUNTER — Ambulatory Visit: Payer: 59 | Admitting: Advanced Practice Midwife

## 2020-10-29 ENCOUNTER — Encounter: Payer: Self-pay | Admitting: Advanced Practice Midwife

## 2020-10-29 ENCOUNTER — Other Ambulatory Visit: Payer: Self-pay

## 2020-10-29 ENCOUNTER — Ambulatory Visit (INDEPENDENT_AMBULATORY_CARE_PROVIDER_SITE_OTHER): Payer: 59 | Admitting: Advanced Practice Midwife

## 2020-10-29 NOTE — Progress Notes (Signed)
    Post Partum Visit Note  Meagan Martin is a 20 y.o. G54P1001 female who presents for a postpartum visit. She is 4 weeks postpartum following a normal spontaneous vaginal delivery.  I have fully reviewed the prenatal and intrapartum course. The delivery was at 39.1 gestational weeks.  Anesthesia: epidural. Postpartum course has been uneventful. Baby is doing well. Baby is feeding by bottle - Carn[employer] Good Start. Bleeding thin lochia. Bowel function is normal. Bladder function is normal. Patient is not sexually active. Contraception method is Depo-Provera injections. Postpartum depression screening: negative.   The pregnancy intention screening data noted above was reviewed. Potential methods of contraception were discussed. The patient elected to proceed with Hormonal Injection.   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  Had some leakage today.   Health Maintenance Due  Topic Date Due  . COVID-19 Vaccine (3 - Booster for Pfizer series) 04/03/2020    The following portions of the patient's history were reviewed and updated as appropriate: allergies, current medications, past family history, past medical history, past social history, past surgical history and problem list.  Review of Systems Pertinent items noted in HPI and remainder of comprehensive ROS otherwise negative.  Objective:  There were no vitals taken for this visit.   General:  alert, cooperative, appears stated age and no distress   Breasts:  normal  Lungs: clear to auscultation bilaterally  Heart:  regular rate and rhythm, S1, S2 normal, no murmur, click, rub or gallop  Abdomen: soft, non-tender; bowel sounds normal; no masses,  no organomegaly   Wound n/a  GU exam:  not indicated       Assessment:   4 weeks postpartum Desire to re-lactate  Normal  postpartum exam.   Plan:   Essential components of care per ACOG recommendations:  1.   Mood and well being: Patient with negative depression screening today. Reviewed local resources for support.  - Patient tobacco use? No.   - hx of drug use? No.    2. Infant care and feeding:  -Patient currently breastmilk feeding? No.  Discussed how to re-lactate. Start putting baby on breast and pumping  -Social determinants of health (SDOH) reviewed in EPIC. No concerns 3. Sexuality, contraception and birth spacing - Patient does not want a pregnancy in the next year.   - Reviewed forms of contraception in tiered fashion. Patient desired Depo-Provera today.   - Discussed birth spacing of 18 months  4. Sleep and fatigue -Encouraged family/partner/community support of 4 hrs of uninterrupted sleep to help with mood and fatigue  5. Physical Recovery  - Discussed patients delivery and complications. She describes her labor as good. - Patient had a Vaginal, no problems at delivery. Patient had a 2nd degree laceration. Perineal healing reviewed. Patient expressed understanding - Patient has urinary incontinence? No. - Patient is safe to resume physical and sexual activity  6.  Health Maintenance - HM due items addressed Yes - Last pap smear No results found for: DIAGPAP Pap smear not done at today's visit.  Will bring her back soon for Pap and Annual exam  -Breast Cancer screening indicated? No.   7. Chronic Disease/Pregnancy Condition follow up: None  - PCP follow up  Armandina Stammer, RN [facility] [employer], [facility]

## 2020-12-16 ENCOUNTER — Ambulatory Visit: Payer: 59

## 2020-12-17 ENCOUNTER — Ambulatory Visit: Payer: Self-pay | Admitting: Advanced Practice Midwife

## 2020-12-25 ENCOUNTER — Ambulatory Visit: Payer: Medicaid Other

## 2021-01-14 ENCOUNTER — Ambulatory Visit: Payer: Medicaid Other

## 2021-02-06 ENCOUNTER — Ambulatory Visit (INDEPENDENT_AMBULATORY_CARE_PROVIDER_SITE_OTHER): Payer: Medicaid Other

## 2021-02-06 DIAGNOSIS — Z3201 Encounter for pregnancy test, result positive: Secondary | ICD-10-CM

## 2021-02-06 LAB — POCT PREGNANCY, URINE: Preg Test, Ur: POSITIVE — AB

## 2021-02-06 NOTE — Progress Notes (Signed)
Pt dropped off urine today for UPT. UPT was positive. Call placed to pt. Spoke with pt and gave results. Pt states had home pregnancy tests as well. Pt is 4 months PP from first pregnancy. Pt is taking PNV and is not breastfeeding currently.  Encouraged pt to call [facility] to make new OB appt. Pt verbalized understanding. Pt denies any vaginal bleeding, pain or abd cramps at this time.   LMP 12/28/20 EDC: 10/04/21 [redacted]w[redacted]d  Achille Xiang, RN

## 2021-02-18 ENCOUNTER — Ambulatory Visit: Payer: Medicaid Other | Admitting: Advanced Practice Midwife

## 2021-02-24 ENCOUNTER — Inpatient Hospital Stay (HOSPITAL_COMMUNITY)
Admission: AD | Admit: 2021-02-24 | Discharge: 2021-02-24 | Disposition: A | Discharge status: Home / Self Care | Payer: Medicaid Other | Attending: Family Medicine | Admitting: Family Medicine

## 2021-02-24 ENCOUNTER — Encounter (HOSPITAL_COMMUNITY): Payer: Self-pay | Admitting: Family Medicine

## 2021-02-24 ENCOUNTER — Inpatient Hospital Stay (HOSPITAL_COMMUNITY): Payer: Medicaid Other

## 2021-02-24 ENCOUNTER — Other Ambulatory Visit: Payer: Self-pay

## 2021-02-24 DIAGNOSIS — Z6791 Unspecified blood type, Rh negative: Secondary | ICD-10-CM

## 2021-02-24 DIAGNOSIS — Z8249 Family history of ischemic heart disease and other diseases of the circulatory system: Secondary | ICD-10-CM | POA: Insufficient documentation

## 2021-02-24 DIAGNOSIS — Z3A01 Less than 8 weeks gestation of pregnancy: Secondary | ICD-10-CM | POA: Insufficient documentation

## 2021-02-24 DIAGNOSIS — Z79899 Other long term (current) drug therapy: Secondary | ICD-10-CM | POA: Insufficient documentation

## 2021-02-24 DIAGNOSIS — O26891 Other specified pregnancy related conditions, first trimester: Secondary | ICD-10-CM | POA: Insufficient documentation

## 2021-02-24 DIAGNOSIS — R109 Unspecified abdominal pain: Secondary | ICD-10-CM

## 2021-02-24 DIAGNOSIS — R102 Pelvic and perineal pain: Secondary | ICD-10-CM | POA: Insufficient documentation

## 2021-02-24 DIAGNOSIS — Z3490 Encounter for supervision of normal pregnancy, unspecified, unspecified trimester: Secondary | ICD-10-CM

## 2021-02-24 DIAGNOSIS — O26899 Other specified pregnancy related conditions, unspecified trimester: Secondary | ICD-10-CM

## 2021-02-24 LAB — URINALYSIS, ROUTINE W REFLEX MICROSCOPIC
Bilirubin Urine: NEGATIVE
Glucose, UA: NEGATIVE mg/dL
Hgb urine dipstick: NEGATIVE
Ketones, ur: 80 mg/dL — AB
Nitrite: NEGATIVE
Protein, ur: 30 mg/dL — AB
Specific Gravity, Urine: 1.033 — ABNORMAL HIGH (ref 1.005–1.030)
pH: 5 (ref 5.0–8.0)

## 2021-02-24 LAB — HCG, QUANTITATIVE, PREGNANCY: hCG, Beta Chain, Quant, S: 128592 m[IU]/mL — ABNORMAL HIGH (ref <5)

## 2021-02-24 LAB — WET PREP, GENITAL
Clue Cells Wet Prep HPF POC: NONE SEEN
Sperm: NONE SEEN
Trich, Wet Prep: NONE SEEN
Yeast Wet Prep HPF POC: NONE SEEN

## 2021-02-24 LAB — COMPREHENSIVE METABOLIC PANEL
ALT: 18 U/L (ref 0–44)
AST: 21 U/L (ref 15–41)
Albumin: 3.8 g/dL (ref 3.5–5.0)
Alkaline Phosphatase: 73 U/L (ref 38–126)
Anion gap: 9 (ref 5–15)
BUN: 8 mg/dL (ref 6–20)
CO2: 23 mmol/L (ref 22–32)
Calcium: 9.2 mg/dL (ref 8.9–10.3)
Chloride: 103 mmol/L (ref 98–111)
Creatinine, Ser: 0.57 mg/dL (ref 0.44–1.00)
GFR, Estimated: >60 mL/min (ref >60)
Glucose, Bld: 96 mg/dL (ref 70–99)
Potassium: 3.4 mmol/L — ABNORMAL LOW (ref 3.5–5.1)
Sodium: 135 mmol/L (ref 135–145)
Total Bilirubin: 0.8 mg/dL (ref 0.3–1.2)
Total Protein: 7.2 g/dL (ref 6.5–8.1)

## 2021-02-24 LAB — CBC
HCT: 36.4 % (ref 36.0–46.0)
Hemoglobin: 12.4 g/dL (ref 12.0–15.0)
MCH: 30.8 pg (ref 26.0–34.0)
MCHC: 34.1 g/dL (ref 30.0–36.0)
MCV: 90.5 fL (ref 80.0–100.0)
Platelets: 157 10*3/uL (ref 150–400)
RBC: 4.02 MIL/uL (ref 3.87–5.11)
RDW: 13.3 % (ref 11.5–15.5)
WBC: 6.8 10*3/uL (ref 4.0–10.5)
nRBC: 0 % (ref 0.0–0.2)

## 2021-02-24 MED ORDER — RHO D IMMUNE GLOBULIN 1500 UNIT/2ML IJ SOSY
300.0000 ug | PREFILLED_SYRINGE | Freq: Once | INTRAMUSCULAR | Status: AC
  Administered 2021-02-24: 300 ug via INTRAMUSCULAR
  Filled 2021-02-24: qty 2
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
## 2021-02-24 NOTE — MAU Note (Signed)
Meagan Martin is a 20 y.o. at [redacted]w[redacted]d here in MAU reporting: states for the past couple of days and yesterday started having lower abdominal pain. No bleeding. Yesterday had some brown discharge.   LMP: 12/27/2020  Onset of complaint: yesterday  Pain score: 5/10  Vitals:   02/24/21 1242  BP: (!) 130/98  Pulse: (!) 114  Resp: 16  Temp: 98.6 F (37 C)  SpO2: 99%     Lab orders placed from triage: UA

## 2021-02-25 LAB — RH IG WORKUP (INCLUDES ABO/RH)
ABO/RH(D): O NEG
Antibody Screen: NEGATIVE
Gestational Age(Wks): 8.3
Unit division: 0

## 2021-02-25 LAB — GC/CHLAMYDIA PROBE AMP (~~LOC~~) NOT AT ARMC
Chlamydia: NEGATIVE
Comment: NEGATIVE
Comment: NORMAL
Neisseria Gonorrhea: NEGATIVE

## 2021-02-26 ENCOUNTER — Telehealth (INDEPENDENT_AMBULATORY_CARE_PROVIDER_SITE_OTHER): Payer: Medicaid Other

## 2021-02-26 DIAGNOSIS — Z3490 Encounter for supervision of normal pregnancy, unspecified, unspecified trimester: Secondary | ICD-10-CM | POA: Insufficient documentation

## 2021-02-26 DIAGNOSIS — O09899 Supervision of other high risk pregnancies, unspecified trimester: Secondary | ICD-10-CM | POA: Insufficient documentation

## 2021-02-26 DIAGNOSIS — Z3491 Encounter for supervision of normal pregnancy, unspecified, first trimester: Secondary | ICD-10-CM

## 2021-02-26 DIAGNOSIS — Z3A00 Weeks of gestation of pregnancy not specified: Secondary | ICD-10-CM

## 2021-02-26 MED ORDER — GOJJI WEIGHT SCALE MISC: 1.0000 | Status: AC | PRN

## 2021-02-26 MED ORDER — BLOOD PRESSURE KIT DEVI: 1.0000 | 0 refills | Status: AC | PRN

## 2021-02-26 NOTE — Progress Notes (Signed)
New OB Intake  I connected with  Meagan Martin on 02/26/21 at  8:15 AM EDT by MyChart Video Visit and verified that I am speaking with the correct person using two identifiers. Nurse is located at [facility] and pt is located at home.  I discussed the limitations, risks, security and privacy concerns of performing an evaluation and management service by telephone and the availability of in person appointments. I also discussed with the patient that there may be a patient responsible charge related to this service. The patient expressed understanding and agreed to proceed.  I explained I am completing New OB Intake today. We discussed her EDD of 10/03/2021 that is based on LMP of 12/27/2020. Pt is G2/P1. I reviewed her allergies, medications, Medical/Surgical/OB history, and appropriate screenings. I informed her of [facility] services. Based on history, this is a/an  pregnancy uncomplicated .   Patient Active Problem List   Diagnosis Date Noted   Rh negative state in antepartum period 02/24/2021   Vaginal delivery 09/29/2020   Mild preeclampsia 09/29/2020   Iron deficiency anemia 09/29/2020   Supervision of normal first pregnancy, antepartum 02/29/2020    Concerns addressed today  Delivery Plans:  Plans to deliver at [facility].   MyChart/Babyscripts MyChart access verified. I explained pt will have some visits in office and some virtually. Babyscripts instructions given and order placed. Patient verifies receipt of registration text/e-mail. Account successfully created and app downloaded.  Blood Pressure Cuff  Blood pressure cuff ordered for patient to pick-up from [employer]. Explained after first prenatal appt pt will check weekly and document in Babyscripts.  Weight scale: Weight scale ordered for patient to pick up from [employer].   Anatomy US Explained first scheduled [place] will be around 19 weeks. Anatomy US scheduled for November 11th at 0930.  Labs Discussed Avelina Laine genetic  screening with patient. Would like both Panorama and Horizon drawn at new OB visit. Routine prenatal labs needed.  Covid Vaccine Patient has covid vaccine.   Mother/ Baby Dyad Candidate?   Not a canidate as this  If yes, offer as possibility   Informed patient of [facility] website  and placed link in her AVS.   Social Determinants of Health Food Insecurity: Patient denies food insecurity. WIC Referral: Patient is interested in referral to [facility].  Transportation: Patient denies transportation needs. Childcare: Discussed no children allowed at ultrasound appointments. Offered childcare services; patient declines childcare services at this time.  Send link to Pregnancy Navigators- patient does not live in [place] residence.     Placed OB Box on problem list and updated  First visit review I reviewed new OB appt with pt. I explained she will have a pelvic exam, ob bloodwork with genetic screening, and PAP smear. Explained pt will be seen by Vergie Living, MD at first visit; encounter routed to appropriate provider. Explained that patient will be seen by pregnancy navigator following visit with provider. [facility] information placed in AVS.   Ralene Bathe, RN 02/26/2021  8:45 AM

## 2021-02-28 ENCOUNTER — Encounter: Payer: Medicaid Other | Admitting: Obstetrics and Gynecology

## 2021-03-05 ENCOUNTER — Encounter: Payer: Medicaid Other | Admitting: Nurse Practitioner

## 2021-03-10 ENCOUNTER — Ambulatory Visit (INDEPENDENT_AMBULATORY_CARE_PROVIDER_SITE_OTHER): Payer: Medicaid Other | Admitting: Obstetrics and Gynecology

## 2021-03-10 ENCOUNTER — Other Ambulatory Visit: Payer: Self-pay

## 2021-03-10 ENCOUNTER — Other Ambulatory Visit (HOSPITAL_COMMUNITY)
Admission: RE | Admit: 2021-03-10 | Discharge: 2021-03-10 | Disposition: A | Discharge status: Home / Self Care | Payer: Medicaid Other | Source: Ambulatory Visit | Attending: Obstetrics and Gynecology | Admitting: Obstetrics and Gynecology

## 2021-03-10 ENCOUNTER — Encounter: Payer: Self-pay | Admitting: Obstetrics and Gynecology

## 2021-03-10 VITALS — BP 108/72 | HR 84 | Wt 139.2 lb

## 2021-03-10 DIAGNOSIS — Z3491 Encounter for supervision of normal pregnancy, unspecified, first trimester: Secondary | ICD-10-CM

## 2021-03-10 DIAGNOSIS — Z3A10 10 weeks gestation of pregnancy: Secondary | ICD-10-CM

## 2021-03-10 DIAGNOSIS — O26899 Other specified pregnancy related conditions, unspecified trimester: Secondary | ICD-10-CM

## 2021-03-10 DIAGNOSIS — O09899 Supervision of other high risk pregnancies, unspecified trimester: Secondary | ICD-10-CM

## 2021-03-10 DIAGNOSIS — Z6791 Unspecified blood type, Rh negative: Secondary | ICD-10-CM

## 2021-03-10 DIAGNOSIS — O09291 Supervision of pregnancy with other poor reproductive or obstetric history, first trimester: Secondary | ICD-10-CM | POA: Insufficient documentation

## 2021-03-10 NOTE — Progress Notes (Signed)
New OB Note  03/10/2021   Clinic: [facility]  Chief Complaint: new OB  Transfer of Care Patient: no  History of Present Illness: Ms. Meagan Martin is a 20 y.o. G2P1001 @ 10/3 weeks (EDC 4/7, based on Patient's last menstrual period was 12/27/2020.=8wk u/s) Preg complicated by has Rh negative state in antepartum period; Supervision of low-risk pregnancy; Short interval between pregnancies affecting pregnancy, antepartum; and History of pre-eclampsia in prior pregnancy, currently pregnant in first trimester on their problem list.   Patient states she had a bad experience at the [facility] office and requested to come here for her care.   Any events prior to today's visit: 8/29 ob triage visit for pain and bleeding and 8wk IUP confirmed; pt received rhogam She has very mild signs or symptoms of nausea/vomiting of pregnancy. She has Negative signs or symptoms of miscarriage or preterm labor  ROS: A 12-point review of systems was performed and negative, except as stated in the above HPI.  OBGYN History: As per HPI. OB History  Gravida Para Term Preterm AB Living  2 1 1     1   SAB IAB Ectopic Multiple Live Births        0 1    # Outcome Date GA Lbr Len/2nd Weight Sex Delivery Anes PTL Lv  2 Current           1 Term 09/27/20 [redacted]w[redacted]d 14:36 / 00:35 6 lb 9.1 oz (2.98 kg) F Vag-Spont EPI  LIV    Any issues with any prior pregnancies: dx with mild pre-eclampsia in labor Prior children are healthy, doing well, and without any problems or issues: yes History of pap smears: No.   Past Medical History: Past Medical History:  Diagnosis Date   Medical history non-contributory    Mild preeclampsia 09/29/2020    Past Surgical History: Past Surgical History:  Procedure Laterality Date   NO PAST SURGERIES      Family History:  Family History  Problem Relation Age of Onset   Diabetes Mother    Diabetes Father    Cancer Maternal Grandmother        throat    Intellectual disability Neg Hx    Hypertension Neg Hx    Stroke Neg Hx    Social History:  Social History   Socioeconomic History   Marital status: Married    Spouse name: Not on file   Number of children: Not on file   Years of education: Not on file   Highest education level: Some college, no degree  Occupational History   Occupation: 11/29/2020  Tobacco Use   Smoking status: Never   Smokeless tobacco: Never  Vaping Use   Vaping Use: Never used  Substance and Sexual Activity   Alcohol use: Never   Drug use: Never   Sexual activity: Yes    Birth control/protection: Injection    Comment: 2 days ago   Other Topics Concern   Not on file  Social History Narrative   Not on file   Social Determinants of Health   Financial Resource Strain: Not on file  Food Insecurity: Not on file  Transportation Needs: Not on file  Physical Activity: Not on file  Stress: Not on file  Social Connections: Not on file  Intimate Partner Violence: Not on file    Allergy: No Known Allergies  Current Outpatient Medications: Prenatal vitamin  Physical Exam:   BP 108/72   Pulse 84   Wt 139 lb  3.2 oz (63.1 kg)   LMP 12/27/2020   BMI 26.30 kg/m  Body mass index is 26.3 kg/m. Contractions: Not present Vag. Bleeding: None. Fundal height: not applicable FHTs: 170s  General appearance: Well nourished, well developed female in no acute distress.  Cardiovascular: S1, S2 normal, no murmur, rub or gallop, regular rate and rhythm Respiratory:  Clear to auscultation bilateral. Normal respiratory effort Abdomen: positive bowel sounds and no masses, hernias; diffusely non tender to palpation, non distended Breasts: pt denies any breast s/s. Neuro/Psych:  Normal mood and affect.  Skin:  Warm and dry.  Lymphatic:  No inguinal lymphadenopathy.   Pelvic exam: is not limited by body habitus EGBUS: within normal limits, Vagina: within normal limits and with no blood in the vault, Cervix: normal  appearing cervix without discharge or lesions, closed/long/high, Uterus:  enlarged, c/w 10 week size, and Adnexa:  normal adnexa and no mass, fullness, tenderness  Laboratory: reviewed  Imaging:  As per HPI  Assessment: pt doing well  Plan: 1. Encounter for supervision of low-risk pregnancy in first trimester Anatomy u/s already scheduled Offer afp nv - [facility] AMB BABYSCRIPTS SCHEDULE OPTIMIZATION - Culture, OB Urine - Genetic Screening - CBC/D/Plt+RPR+Rh+ABO+RubIgG... - Hemoglobin A1c - Cytology - PAP( Epes) - Flu Vaccine QUAD 29mo+IM (Fluarix, Fluzone & Alfiuria Quad PF) - Comprehensive metabolic panel - Protein / creatinine ratio, urine  2. Short interval between pregnancies affecting pregnancy, antepartum D/w her increased PTL/PTB, HTN risk  3. Rh negative state in antepartum period  4. History of pre-eclampsia in prior pregnancy, currently pregnant in first trimester D/w her I recommend starting low dose asa at her nv to hopefully prevent this  5. [redacted] weeks gestation of pregnancy  Problem list reviewed and updated.  Follow up in 5 weeks.  >50% of 35 min visit spent on counseling and coordination of care.     Cornelia Copa MD Attending Center for [facility])

## 2021-03-11 LAB — PROTEIN / CREATININE RATIO, URINE
Creatinine, Urine: 123.7 mg/dL
Protein, Ur: 10.7 mg/dL
Protein/Creat Ratio: 86 mg/g creat (ref 0–200)

## 2021-03-12 LAB — CULTURE, OB URINE

## 2021-03-12 LAB — URINE CULTURE, OB REFLEX

## 2021-03-13 LAB — COMPREHENSIVE METABOLIC PANEL
ALT: 12 IU/L (ref 0–32)
AST: 10 IU/L (ref 0–40)
Albumin/Globulin Ratio: 1.5 (ref 1.2–2.2)
Albumin: 4.4 g/dL (ref 3.9–5.0)
Alkaline Phosphatase: 85 IU/L (ref 44–121)
BUN/Creatinine Ratio: 11 (ref 9–23)
BUN: 7 mg/dL (ref 6–20)
Bilirubin Total: 0.2 mg/dL (ref 0.0–1.2)
CO2: 19 mmol/L — ABNORMAL LOW (ref 20–29)
Calcium: 10 mg/dL (ref 8.7–10.2)
Chloride: 102 mmol/L (ref 96–106)
Creatinine, Ser: 0.65 mg/dL (ref 0.57–1.00)
Globulin, Total: 3 g/dL (ref 1.5–4.5)
Glucose: 91 mg/dL (ref 65–99)
Potassium: 3.8 mmol/L (ref 3.5–5.2)
Sodium: 138 mmol/L (ref 134–144)
Total Protein: 7.4 g/dL (ref 6.0–8.5)
eGFR: 128 mL/min/{1.73_m2} (ref >59)

## 2021-03-13 LAB — CBC/D/PLT+RPR+RH+ABO+RUBIGG...
Basophils Absolute: 0 10*3/uL (ref 0.0–0.2)
Basos: 0 %
EOS (ABSOLUTE): 0 10*3/uL (ref 0.0–0.4)
Eos: 0 %
HCV Ab: <0.1 s/co ratio (ref 0.0–0.9)
HIV Screen 4th Generation wRfx: NONREACTIVE
Hematocrit: 37.9 % (ref 34.0–46.6)
Hemoglobin: 13 g/dL (ref 11.1–15.9)
Hepatitis B Surface Ag: NEGATIVE
Immature Grans (Abs): 0 10*3/uL (ref 0.0–0.1)
Immature Granulocytes: 0 %
Lymphocytes Absolute: 1.9 10*3/uL (ref 0.7–3.1)
Lymphs: 25 %
MCH: 30.5 pg (ref 26.6–33.0)
MCHC: 34.3 g/dL (ref 31.5–35.7)
MCV: 89 fL (ref 79–97)
Monocytes Absolute: 0.5 10*3/uL (ref 0.1–0.9)
Monocytes: 7 %
Neutrophils Absolute: 5.1 10*3/uL (ref 1.4–7.0)
Neutrophils: 68 %
Platelets: 157 10*3/uL (ref 150–450)
RBC: 4.26 x10E6/uL (ref 3.77–5.28)
RDW: 13.6 % (ref 11.7–15.4)
RPR Ser Ql: NONREACTIVE
Rh Factor: NEGATIVE
Rubella Antibodies, IGG: 3.31 index (ref >0.99)
WBC: 7.6 10*3/uL (ref 3.4–10.8)

## 2021-03-13 LAB — HEMOGLOBIN A1C
Est. average glucose Bld gHb Est-mCnc: 91 mg/dL
Hgb A1c MFr Bld: 4.8 % (ref 4.8–5.6)

## 2021-03-13 LAB — HCV INTERPRETATION

## 2021-03-13 LAB — AB SCR+ANTIBODY ID: Antibody Screen: POSITIVE — AB

## 2021-03-14 LAB — CYTOLOGY - PAP
Chlamydia: NEGATIVE
Comment: NEGATIVE
Comment: NORMAL
Diagnosis: NEGATIVE
Neisseria Gonorrhea: NEGATIVE

## 2021-03-20 ENCOUNTER — Encounter: Payer: Self-pay | Admitting: General Practice

## 2021-04-14 ENCOUNTER — Other Ambulatory Visit: Payer: Self-pay

## 2021-04-14 ENCOUNTER — Ambulatory Visit (INDEPENDENT_AMBULATORY_CARE_PROVIDER_SITE_OTHER): Payer: Medicaid Other | Admitting: Student

## 2021-04-14 VITALS — BP 115/90 | HR 87 | Wt 136.9 lb

## 2021-04-14 DIAGNOSIS — Z3491 Encounter for supervision of normal pregnancy, unspecified, first trimester: Secondary | ICD-10-CM

## 2021-04-14 DIAGNOSIS — Z23 Encounter for immunization: Secondary | ICD-10-CM

## 2021-04-14 DIAGNOSIS — Z3A15 15 weeks gestation of pregnancy: Secondary | ICD-10-CM

## 2021-04-14 DIAGNOSIS — O09899 Supervision of other high risk pregnancies, unspecified trimester: Secondary | ICD-10-CM

## 2021-04-14 MED ORDER — ASPIRIN 81 MG PO CHEW: 81.0000 mg | CHEWABLE_TABLET | Freq: Every day | ORAL | 1 refills | Status: DC

## 2021-04-14 NOTE — Progress Notes (Signed)
   PRENATAL VISIT NOTE  Subjective:  Meagan Martin is a 20 y.o. G2P1001 at [redacted]w[redacted]d being seen today for ongoing prenatal care.  She is currently monitored for the following issues for this low-risk pregnancy and has Rh negative state in antepartum period; Supervision of low-risk pregnancy; Short interval between pregnancies affecting pregnancy, antepartum; and History of pre-eclampsia in prior pregnancy, currently pregnant in first trimester on their problem list.  Patient reports no complaints. She is having some sciatica pain; would like to meet with DO and discuss; this worked with her in last pregnancy> pain improved after she saw Dr. Adrian Blackwater.  Discussed BC, patient does not want to go back to Depo, is scared of nexplanon, condoms did not work for her. She is interested in IUD outpatient (does not qualify for grant for INpatient L[facility])  Contractions: Not present. Vag. Bleeding: None.  Movement: Present. Denies leaking of fluid.   The following portions of the patient's history were reviewed and updated as appropriate: allergies, current medications, past family history, past medical history, past social history, past surgical history and problem list.   Objective:   Vitals:   04/14/21 1127  BP: 115/90  Pulse: 87  Weight: 136 lb 14.4 oz (62.1 kg)    Fetal Status: Fetal Heart Rate (bpm): 152   Movement: Present     General:  Alert, oriented and cooperative. Patient is in no acute distress.  Skin: Skin is warm and dry. No rash noted.   Cardiovascular: Normal heart rate noted  Respiratory: Normal respiratory effort, no problems with respiration noted  Abdomen: Soft, gravid, appropriate for gestational age.  Pain/Pressure: Absent     Pelvic: Cervical exam deferred        Extremities: Normal range of motion.  Edema: None  Mental Status: Normal mood and affect. Normal behavior. Normal judgment and thought content.   Assessment and Plan:  Pregnancy: G2P1001 at [redacted]w[redacted]d 1. Encounter  for supervision of low-risk pregnancy in first trimester  - Flu Vaccine QUAD 36+ mos IM (Fluarix, Quad PF)  2. Short interval between pregnancies affecting pregnancy, antepartum -BP cuff at home -BP borderline today; had one elevated BP in August at MAU visit when she was upset.  -needs scale; CMA will give -does not want AFP or vaginal swabs today -started on baby ASA  --Preterm labor symptoms and general obstetric precautions including but not limited to vaginal bleeding, contractions, leaking of fluid and fetal movement were reviewed in detail with the patient. Please refer to After Visit Summary for other counseling recommendations.   Return in about 4 weeks (around 05/12/2021), or please schedule with sam beard next appt because patient is having leg pain and wants an adjustment.  Future Appointments  Date Time Provider Department Center  05/09/2021  9:45 AM [facility]  05/20/2021 10:55 AM Allayne Stack, DO [facility]    Marylene Land, [place]

## 2021-04-14 NOTE — Progress Notes (Signed)
Pt noted green discharge two weeks ago. No other symptoms with it. No green discharge since.

## 2021-04-21 ENCOUNTER — Other Ambulatory Visit: Payer: Self-pay

## 2021-04-21 ENCOUNTER — Encounter (HOSPITAL_COMMUNITY): Payer: Self-pay | Admitting: Obstetrics & Gynecology

## 2021-04-21 ENCOUNTER — Inpatient Hospital Stay (HOSPITAL_COMMUNITY)
Admission: AD | Admit: 2021-04-21 | Discharge: 2021-04-21 | Disposition: A | Discharge status: Home / Self Care | Payer: Medicaid Other | Attending: Obstetrics & Gynecology | Admitting: Obstetrics & Gynecology

## 2021-04-21 DIAGNOSIS — O26892 Other specified pregnancy related conditions, second trimester: Secondary | ICD-10-CM | POA: Diagnosis not present

## 2021-04-21 DIAGNOSIS — Z7982 Long term (current) use of aspirin: Secondary | ICD-10-CM | POA: Insufficient documentation

## 2021-04-21 DIAGNOSIS — R12 Heartburn: Secondary | ICD-10-CM

## 2021-04-21 DIAGNOSIS — O219 Vomiting of pregnancy, unspecified: Secondary | ICD-10-CM | POA: Diagnosis not present

## 2021-04-21 DIAGNOSIS — O99612 Diseases of the digestive system complicating pregnancy, second trimester: Secondary | ICD-10-CM | POA: Diagnosis not present

## 2021-04-21 DIAGNOSIS — Z3A16 16 weeks gestation of pregnancy: Secondary | ICD-10-CM

## 2021-04-21 DIAGNOSIS — Z8759 Personal history of other complications of pregnancy, childbirth and the puerperium: Secondary | ICD-10-CM | POA: Diagnosis not present

## 2021-04-21 LAB — URINALYSIS, ROUTINE W REFLEX MICROSCOPIC
Bilirubin Urine: NEGATIVE
Glucose, UA: NEGATIVE mg/dL
Hgb urine dipstick: NEGATIVE
Ketones, ur: 5 mg/dL — AB
Leukocytes,Ua: NEGATIVE
Nitrite: NEGATIVE
Protein, ur: NEGATIVE mg/dL
Specific Gravity, Urine: 1.026 (ref 1.005–1.030)
pH: 5 (ref 5.0–8.0)

## 2021-04-21 MED ORDER — ALUM & MAG HYDROXIDE-SIMETH 200-200-20 MG/5ML PO SUSP
30.0000 mL | Freq: Once | ORAL | Status: AC
  Administered 2021-04-21: 30 mL via ORAL
  Filled 2021-04-21: qty 30

## 2021-04-21 MED ORDER — FAMOTIDINE 20 MG PO TABS: 20.0000 mg | ORAL_TABLET | Freq: Two times a day (BID) | ORAL | 0 refills | Status: DC

## 2021-04-21 MED ORDER — LIDOCAINE VISCOUS HCL 2 % MT SOLN
15.0000 mL | Freq: Once | OROMUCOSAL | Status: AC
  Administered 2021-04-21: 15 mL via ORAL
  Filled 2021-04-21: qty 15

## 2021-04-21 MED ORDER — ONDANSETRON 4 MG PO TBDP
8.0000 mg | ORAL_TABLET | Freq: Once | ORAL | Status: AC
  Administered 2021-04-21: 8 mg via ORAL
  Filled 2021-04-21: qty 2

## 2021-04-21 MED ORDER — ONDANSETRON 8 MG PO TBDP: 8.0000 mg | ORAL_TABLET | Freq: Three times a day (TID) | ORAL | 0 refills | Status: DC | PRN

## 2021-04-21 MED ORDER — ACETAMINOPHEN 500 MG PO TABS
1000.0000 mg | ORAL_TABLET | Freq: Once | ORAL | Status: AC
  Administered 2021-04-21: 1000 mg via ORAL
  Filled 2021-04-21: qty 2

## 2021-04-21 NOTE — MAU Provider Note (Signed)
History     CSN: [id number]  Arrival date and time: 04/21/21 0153   Event Date/Time   First Provider Initiated Contact with Patient 04/21/21 0236      Chief Complaint  Patient presents with   Abdominal Pain   Emesis   Diarrhea   HPI Meagan Martin is a 20 y.o. G2P1001 at 89w3dwho presents with nausea and vomiting. She reports she has vomited twice today which cause mid upper abdominal pain. She reports the pain is burning and constant. She is not having nausea or vomiting now. She also reports 2 episodes of diarrhea today. She denies any bleeding or leaking of fluid.  OB History     Gravida  2   Para  1   Term  1   Preterm      AB      Living  1      SAB      IAB      Ectopic      Multiple  0   Live Births  1           Past Medical History:  Diagnosis Date   Medical history non-contributory    Mild preeclampsia 09/29/2020    Past Surgical History:  Procedure Laterality Date   NO PAST SURGERIES      Family History  Problem Relation Age of Onset   Diabetes Mother    Diabetes Father    Cancer Maternal Grandmother        throat   Intellectual disability Neg Hx    Hypertension Neg Hx    Stroke Neg Hx     Social History   Tobacco Use   Smoking status: Never   Smokeless tobacco: Never  Vaping Use   Vaping Use: Never used  Substance Use Topics   Alcohol use: Never   Drug use: Never    Allergies: No Known Allergies  Medications Prior to Admission  Medication Sig Dispense Refill Last Dose   aspirin 81 MG chewable tablet Chew 1 tablet (81 mg total) by mouth daily. 90 tablet 1 04/20/2021   Prenatal Vit-Fe Fumarate-FA (PREPLUS) 27-1 MG TABS Take 1 tablet by mouth daily.   04/20/2021   Blood Pressure Monitoring (BLOOD PRESSURE KIT) DEVI 1 Device by Other route as needed. 1 each 0    Misc. Devices (GOJJI WEIGHT SCALE) MISC 1 Device by Does not apply route as needed. (Patient not taking: No sig reported) 1 each DEVICE     Review of  Systems  Constitutional: Negative.  Negative for fatigue and fever.  HENT: Negative.    Respiratory: Negative.  Negative for shortness of breath.   Cardiovascular: Negative.  Negative for chest pain.  Gastrointestinal:  Positive for abdominal pain, nausea and vomiting. Negative for constipation and diarrhea.  Genitourinary: Negative.  Negative for dysuria, vaginal bleeding and vaginal discharge.  Neurological: Negative.  Negative for dizziness and headaches.  Physical Exam   Blood pressure 118/69, pulse 81, temperature 97.9 F (36.6 C), temperature source Oral, resp. rate 14, height _0  (1.549 m), weight 63.5 kg, last menstrual period 12/27/2020, SpO2 99 %, currently breastfeeding.  Physical Exam Vitals and nursing note reviewed.  Constitutional:      General: She is not in acute distress.    Appearance: She is well-developed.  HENT:     Head: Normocephalic.  Eyes:     Pupils: Pupils are equal, round, and reactive to light.  Cardiovascular:     Rate and Rhythm: Normal  rate and regular rhythm.     Heart sounds: Normal heart sounds.  Pulmonary:     Effort: Pulmonary effort is normal. No respiratory distress.     Breath sounds: Normal breath sounds.  Abdominal:     General: Bowel sounds are normal. There is no distension.     Palpations: Abdomen is soft.     Tenderness: There is no abdominal tenderness.  Skin:    General: Skin is warm and dry.  Neurological:     Mental Status: She is alert and oriented to person, place, and time.  Psychiatric:        Mood and Affect: Mood normal.        Behavior: Behavior normal.        Thought Content: Thought content normal.        Judgment: Judgment normal.   FHT: 142 bpm  MAU Course  Procedures Results for orders placed or performed during the hospital encounter of 04/21/21 (from the past 24 hour(s))  Urinalysis, Routine w reflex microscopic     Status: Abnormal   Collection Time: 04/21/21  2:19 AM  Result Value Ref Range   Color,  Urine YELLOW YELLOW   APPearance HAZY (A) CLEAR   Specific Gravity, Urine 1.026 1.005 - 1.030   pH 5.0 5.0 - 8.0   Glucose, UA NEGATIVE NEGATIVE mg/dL   Hgb urine dipstick NEGATIVE NEGATIVE   Bilirubin Urine NEGATIVE NEGATIVE   Ketones, ur 5 (A) NEGATIVE mg/dL   Protein, ur NEGATIVE NEGATIVE mg/dL   Nitrite NEGATIVE NEGATIVE   Leukocytes,Ua NEGATIVE NEGATIVE    MDM UA Zofran PO GI cocktail PO Tylenol PO  Assessment and Plan   1. Heartburn during pregnancy in second trimester   2. [redacted] weeks gestation of pregnancy   3. Nausea/vomiting in pregnancy    -Discharge home in stable condition -Rx for zofran and pepcid sent to patient's pharmacy -Second trimester precautions discussed -Patient advised to follow-up with OB as scheduled for prenatal care -Patient may return to MAU as needed or if her condition were to change or worsen   Wende Mott CNM 04/21/2021, 2:36 AM

## 2021-04-21 NOTE — Discharge Instructions (Signed)

## 2021-04-21 NOTE — MAU Note (Signed)
..  Meagan Martin is a 20 y.o. at [redacted]w[redacted]d here in MAU reporting: at 12:30am began having upper abdominal pain that feels like burning, it has calmed down some, has not taken anything for it. Vomited twice and had 2 diarrhea occurences since then. No one around her is sick, had chicken tenders at concert last night.   Pain score: 3/10 Vitals:   04/21/21 0208  BP: 118/69  Pulse: 81  Resp: 14  Temp: 97.9 F (36.6 C)  SpO2: 99%   Lab orders placed from triage: UA

## 2021-04-23 ENCOUNTER — Encounter: Payer: Self-pay | Admitting: *Deleted

## 2021-05-09 ENCOUNTER — Other Ambulatory Visit: Payer: Self-pay

## 2021-05-09 ENCOUNTER — Ambulatory Visit: Payer: Medicaid Other | Attending: Obstetrics and Gynecology

## 2021-05-09 DIAGNOSIS — Z3491 Encounter for supervision of normal pregnancy, unspecified, first trimester: Secondary | ICD-10-CM | POA: Insufficient documentation

## 2021-05-20 ENCOUNTER — Telehealth (INDEPENDENT_AMBULATORY_CARE_PROVIDER_SITE_OTHER): Payer: Medicaid Other | Admitting: Family Medicine

## 2021-05-20 DIAGNOSIS — Z3491 Encounter for supervision of normal pregnancy, unspecified, first trimester: Secondary | ICD-10-CM

## 2021-05-20 NOTE — Progress Notes (Signed)
1058: Attempted to reach patient for virtual visit. No answer, no voicemail option set up. Will re-attempted to contact patient.  1104: Reached patient via phone and instructed patient to log onto visit, resent link to verified phone number.   1112: Patient did not log onto appointment, unable to reach.     Wynona Canes, CMA

## 2021-05-21 NOTE — Progress Notes (Signed)
Patient unable to be reached. Will need to reschedule.   Allayne Stack, DO

## 2021-05-26 ENCOUNTER — Encounter: Payer: Self-pay | Admitting: Student

## 2021-05-26 DIAGNOSIS — O26892 Other specified pregnancy related conditions, second trimester: Secondary | ICD-10-CM

## 2021-05-26 DIAGNOSIS — M25559 Pain in unspecified hip: Secondary | ICD-10-CM

## 2021-06-16 ENCOUNTER — Encounter: Payer: Medicaid Other | Admitting: Family Medicine

## 2021-06-18 NOTE — Therapy (Incomplete)
OUTPATIENT PHYSICAL THERAPY THORACOLUMBAR EVALUATION   Patient Name: Meagan Martin MRN: [id number] DOB:[birth year], 20 y.o., female Today's Date: 06/18/2021    Past Medical History:  Diagnosis Date   Medical history non-contributory    Mild preeclampsia 09/29/2020   Past Surgical History:  Procedure Laterality Date   NO PAST SURGERIES     Patient Active Problem List   Diagnosis Date Noted   History of pre-eclampsia in prior pregnancy, currently pregnant in first trimester 03/10/2021   Supervision of low-risk pregnancy 02/26/2021   Short interval between pregnancies affecting pregnancy, antepartum 02/26/2021   Rh negative state in antepartum period 02/24/2021    PCP: Pcp, No  REFERRING PROVIDER: Venora Maples, MD  REFERRING DIAG: O26.892,M25.559 (ICD-10-CM) - Pregnancy related hip pain in second trimester, antepartum   THERAPY DIAG:  No diagnosis found.  ONSET DATE: ***  SUBJECTIVE:                                                                                                                                                                                           SUBJECTIVE STATEMENT: *** PERTINENT HISTORY:  ***  PAIN:  Are you having pain? {yes/no:20286} VAS scale: ***/10 Pain location: *** Pain orientation: {Pain Orientation:25161}  PAIN TYPE: {type:313116} Pain description: {PAIN DESCRIPTION:21022940}  Aggravating factors: *** Relieving factors: ***  PRECAUTIONS: Other: 2nd trimester of pregnancy, limit supine positioning  WEIGHT BEARING RESTRICTIONS {Yes ***/No:24003}  FALLS:  Has patient fallen in last 6 months? {yes/no:20286}, Number of falls: ***  LIVING ENVIRONMENT: Lives with: {[facility] lives with:25569::"lives with their family"} Lives in: {Lives in:25570} Stairs: {yes/no:20286}; {Stairs:24000} Has following equipment at home: {Assistive devices:23999}  OCCUPATION: ***  PLOF: {PLOF:24004}  PATIENT GOALS ***   OBJECTIVE:    DIAGNOSTIC FINDINGS:  ***  PATIENT SURVEYS:  N/A MCD  SCREENING FOR RED FLAGS: Bowel or bladder incontinence: {Yes/No:304960894} Cauda equina syndrome: {Yes/No:304960894}  COGNITION:  Overall cognitive status: {cognition:24006}     SENSATION:  Light touch: {intact/deficits:24005}  Stereognosis: {intact/deficits:24005}  Hot/Cold: {intact/deficits:24005}  Proprioception: {intact/deficits:24005}  MUSCLE LENGTH: Hamstrings: Right *** deg; Left *** deg Thomas test: Right *** deg; Left *** deg  POSTURE:  ***  PALPATION: ***  LUMBARAROM/PROM  A/PROM A/PROM  06/18/2021  Flexion   Extension   Right lateral flexion   Left lateral flexion   Right rotation   Left rotation    (Blank rows = not tested)  LE AROM/PROM:  A/PROM Right 06/18/2021 Left 06/18/2021  Hip flexion    Hip extension    Hip abduction    Hip adduction    Hip internal rotation    Hip external rotation  Knee flexion    Knee extension    Ankle dorsiflexion    Ankle plantarflexion    Ankle inversion    Ankle eversion     (Blank rows = not tested)  LE MMT:  MMT Right 06/18/2021 Left 06/18/2021  Hip flexion    Hip extension    Hip abduction    Hip adduction    Hip internal rotation    Hip external rotation    Knee flexion    Knee extension    Ankle dorsiflexion    Ankle plantarflexion    Ankle inversion    Ankle eversion     (Blank rows = not tested)  LUMBAR SPECIAL TESTS:  {lumbar special test:25242}  FUNCTIONAL TESTS:  {Functional tests:24029}  GAIT: Distance walked: *** Assistive device utilized: {Assistive devices:23999} Level of assistance: {Levels of assistance:24026} Comments: ***    TODAY'S TREATMENT  ***   PATIENT EDUCATION:  Education details: *** Person educated: {Person educated:25204} Education method: {Education Method:25205} Education comprehension: {Education Comprehension:25206}   HOME EXERCISE PROGRAM: ***  ASSESSMENT:  CLINICAL  IMPRESSION: Patient is a 20 y.o. female who was seen today for physical therapy evaluation and treatment for ***. Objective impairments include {opptimpairments:25111}. These impairments are limiting patient from {activity limitations:25113}. Personal factors including {Personal factors:25162} are also affecting patient's functional outcome. Patient will benefit from skilled PT to address above impairments and improve overall function.  REHAB POTENTIAL: {rehabpotential:25112}  CLINICAL DECISION MAKING: {clinical decision making:25114}  EVALUATION COMPLEXITY: {Evaluation complexity:25115}   GOALS:   SHORT TERM GOALS:  STG Name Target Date Goal status  1 Patient will be independent and compliant with initial HEP.   Baseline: issued at eval 07/02/2021 INITIAL  2 *** Baseline:  {follow up:25551} INITIAL  3 *** Baseline: {follow up:25551} INITIAL  4 *** Baseline: {follow up:25551} INITIAL   LONG TERM GOALS:   LTG Name Target Date Goal status  1 *** Baseline: {follow up:25551} INITIAL  2 *** Baseline: {follow up:25551} INITIAL  3 *** Baseline: {follow up:25551} INITIAL  4 *** Baseline: {follow up:25551} INITIAL   PLAN: PT FREQUENCY: {rehab frequency:25116}  PT DURATION: {rehab duration:25117}  PLANNED INTERVENTIONS: {rehab planned interventions:25118::"Therapeutic exercises","Therapeutic activity","Neuro Muscular re-education","Balance training","Gait training","Patient/Family education","Joint mobilization"}  PLAN FOR NEXT SESSION: ***   Eyan Hagood C Linsey Hirota 06/18/2021, 2:28 PM

## 2021-06-19 ENCOUNTER — Encounter: Payer: Medicaid Other | Admitting: Obstetrics and Gynecology

## 2021-06-19 ENCOUNTER — Ambulatory Visit: Payer: Medicaid Other

## 2021-06-29 NOTE — L&D Delivery Note (Signed)
OB/GYN Faculty Practice Delivery Note ? ?Meagan Martin is a 21 y.o. G2P1001 s/p vag del at [redacted]w[redacted]d. She was admitted for IOL due to gHTN noted during her prenatal visit.  ? ?ROM: 3h 40m with clear fluid ?GBS Status: neg ?Maximum Maternal Temperature: 98.4 ? ?Labor Progress: ?Meagan Martin noted a mild H/A during her prenatal visit on 3/24 as well as mild range BP elevations; she was sent to [facility] for IOL however her H/A had resolved by that time. She had cytotec x 2 doses as well as a foley placement followed by SROM and progression to complete. ? ?Delivery Date/Time: September 20, 2021 at 0131 ?Delivery: Called to room and patient was complete and pushing. Head delivered ROA. No nuchal cord present. Shoulder and body delivered in usual fashion. Infant with spontaneous cry, placed on mother's abdomen, dried and stimulated. Cord clamped x 2 after 1-minute delay, and cut by FOB. Cord blood drawn. Placenta delivered spontaneously with gentle cord traction. Fundus firm with massage and Pitocin. Labia, perineum, vagina, and cervix inspected  and found to be intact. She then had a postplacental Liletta placement (see Procedure note). ? ?Placenta: spont, intact; to L&D ?Complications: none ?Lacerations: none ?EBL: 200cc ?Analgesia: epidural ? ?She had some trickling/clots, potentially due to delay with postpartum Pitocin until after IUD was placed; she was given cytotec buccal and PR with good effect. ? ?Postpartum Planning ?[x]  message to sent to schedule follow-up  ? ?Infant: boy  APGARs 9/9  2855g (6lb 4.7oz) ? ?11/9, CNM  ?09/20/2021 ?2:04 AM  ?

## 2021-07-07 ENCOUNTER — Ambulatory Visit (INDEPENDENT_AMBULATORY_CARE_PROVIDER_SITE_OTHER): Payer: Medicaid Other | Admitting: Family Medicine

## 2021-07-07 ENCOUNTER — Encounter: Payer: Self-pay | Admitting: Family Medicine

## 2021-07-07 ENCOUNTER — Other Ambulatory Visit: Payer: Self-pay

## 2021-07-07 VITALS — BP 105/69 | HR 83 | Wt 145.2 lb

## 2021-07-07 DIAGNOSIS — O26899 Other specified pregnancy related conditions, unspecified trimester: Secondary | ICD-10-CM

## 2021-07-07 DIAGNOSIS — M79604 Pain in right leg: Secondary | ICD-10-CM

## 2021-07-07 DIAGNOSIS — Z23 Encounter for immunization: Secondary | ICD-10-CM

## 2021-07-07 DIAGNOSIS — O09899 Supervision of other high risk pregnancies, unspecified trimester: Secondary | ICD-10-CM

## 2021-07-07 DIAGNOSIS — Z6791 Unspecified blood type, Rh negative: Secondary | ICD-10-CM

## 2021-07-07 DIAGNOSIS — O09291 Supervision of pregnancy with other poor reproductive or obstetric history, first trimester: Secondary | ICD-10-CM

## 2021-07-07 DIAGNOSIS — Z3492 Encounter for supervision of normal pregnancy, unspecified, second trimester: Secondary | ICD-10-CM

## 2021-07-07 DIAGNOSIS — Z3A27 27 weeks gestation of pregnancy: Secondary | ICD-10-CM

## 2021-07-07 MED ORDER — RHO D IMMUNE GLOBULIN 1500 UNIT/2ML IJ SOSY: 300.0000 ug | PREFILLED_SYRINGE | Freq: Once | INTRAMUSCULAR | Status: DC

## 2021-07-07 NOTE — Progress Notes (Signed)
° ° °  Subjective:  Meagan Martin is a 21 y.o. G2P1001 at [redacted]w[redacted]d being seen today for ongoing prenatal care.  She is currently monitored for the following issues for this low-risk pregnancy and has Rh negative state in antepartum period; Supervision of low-risk pregnancy; Short interval between pregnancies affecting pregnancy, antepartum; and History of pre-eclampsia in prior pregnancy, currently pregnant in first trimester on their problem list.  Patient reports she is doing well. Her previous right leg pain is doing a lot better, but still will have some posterior right leg/hip/back sharp pain when she moves in bed. Not bothering her during the day. No weakness/numbness.  Contractions: Not present. Vag. Bleeding: None.  Movement: Present. Denies leaking of fluid.    The following portions of the patient's history were reviewed and updated as appropriate: allergies, current medications, past family history, past medical history, past social history, past surgical history and problem list.   Objective:   Vitals:   07/07/21 1051  BP: 105/69  Pulse: 83  Weight: 145 lb 3.2 oz (65.9 kg)    Fetal Status: Fetal Heart Rate (bpm): 150 Fundal Height: 27 cm Movement: Present     General:  Alert, oriented and cooperative. Patient is in no acute distress.  Skin: Skin is warm and dry. No rash noted.   Cardiovascular: Normal heart rate noted  Respiratory: Normal respiratory effort, no problems with respiration noted  Abdomen: Soft, gravid, appropriate for gestational age. Pain/Pressure: Absent     Pelvic:  Cervical exam deferred      Non-tender to palpation of lumbar spine processes and paraspinal muscles, however tense. No piriformis tenderness b/l. Anterior innominate on the R.   Extremities: Normal range of motion.     Mental Status: Normal mood and affect. Normal behavior. Normal judgment and thought content.    Assessment and Plan:  Pregnancy: G2P1001 at [redacted]w[redacted]d  1. Encounter for  supervision of low-risk pregnancy in second trimester Doing well with normal fetal movement.   2. [redacted] weeks gestation of pregnancy Tdap given, will return tomorrow fasting for gtt/labs.  - HIV Antibody (routine testing w rflx); Future - RPR; Future - Glucose Tolerance, 2 Hours w/1 Hour; Future - CBC; Future  3. Rh negative state in antepartum period RhoGAM given today.   4. Short interval between pregnancies affecting pregnancy, antepartum  5. Pain of right lower extremity Performed lumbar roll, anterior innominate ME on the R, and soft tissue with some improvement. Tolerated well. Encouraged nightly stretches and lifting legs when moving in bed.   6. History of pre-eclampsia in prior pregnancy, currently pregnant in first trimester BP WNL. Cont ASA.    Preterm labor symptoms and general obstetric precautions including but not limited to vaginal bleeding, contractions, leaking of fluid and fetal movement were reviewed in detail with the patient. Please refer to After Visit Summary for other counseling recommendations.   Return in about 2 weeks (around 07/21/2021) for LROB.   Allayne Stack, DO

## 2021-07-08 ENCOUNTER — Other Ambulatory Visit (INDEPENDENT_AMBULATORY_CARE_PROVIDER_SITE_OTHER): Payer: Medicaid Other

## 2021-07-08 DIAGNOSIS — Z6791 Unspecified blood type, Rh negative: Secondary | ICD-10-CM | POA: Diagnosis not present

## 2021-07-08 DIAGNOSIS — O26899 Other specified pregnancy related conditions, unspecified trimester: Secondary | ICD-10-CM | POA: Diagnosis not present

## 2021-07-08 DIAGNOSIS — Z3A27 27 weeks gestation of pregnancy: Secondary | ICD-10-CM

## 2021-07-08 DIAGNOSIS — Z2913 Encounter for prophylactic Rho(D) immune globulin: Secondary | ICD-10-CM

## 2021-07-08 MED ORDER — RHO D IMMUNE GLOBULIN 1500 UNIT/2ML IJ SOSY
300.0000 ug | PREFILLED_SYRINGE | Freq: Once | INTRAMUSCULAR | Status: AC
  Administered 2021-07-08: 300 ug via INTRAMUSCULAR

## 2021-07-09 ENCOUNTER — Encounter: Payer: Self-pay | Admitting: *Deleted

## 2021-07-09 LAB — CBC
Hematocrit: 35.5 % (ref 34.0–46.6)
Hemoglobin: 11.7 g/dL (ref 11.1–15.9)
MCH: 29.7 pg (ref 26.6–33.0)
MCHC: 33 g/dL (ref 31.5–35.7)
MCV: 90 fL (ref 79–97)
Platelets: 185 10*3/uL (ref 150–450)
RBC: 3.94 x10E6/uL (ref 3.77–5.28)
RDW: 13.1 % (ref 11.7–15.4)
WBC: 6.3 10*3/uL (ref 3.4–10.8)

## 2021-07-09 LAB — HIV ANTIBODY (ROUTINE TESTING W REFLEX): HIV Screen 4th Generation wRfx: NONREACTIVE

## 2021-07-09 LAB — GLUCOSE TOLERANCE, 2 HOURS W/ 1HR
Glucose, 1 hour: 168 mg/dL (ref 70–179)
Glucose, 2 hour: 133 mg/dL (ref 70–152)
Glucose, Fasting: 88 mg/dL (ref 70–91)

## 2021-07-09 LAB — ANTIBODY SCREEN: Antibody Screen: NEGATIVE

## 2021-07-09 LAB — RPR: RPR Ser Ql: NONREACTIVE

## 2021-07-21 ENCOUNTER — Other Ambulatory Visit: Payer: Self-pay

## 2021-07-21 ENCOUNTER — Ambulatory Visit (INDEPENDENT_AMBULATORY_CARE_PROVIDER_SITE_OTHER): Payer: Medicaid Other | Admitting: Family Medicine

## 2021-07-21 ENCOUNTER — Encounter: Payer: Self-pay | Admitting: Family Medicine

## 2021-07-21 VITALS — BP 110/81 | HR 95 | Wt 145.0 lb

## 2021-07-21 DIAGNOSIS — Z6791 Unspecified blood type, Rh negative: Secondary | ICD-10-CM

## 2021-07-21 DIAGNOSIS — Z3493 Encounter for supervision of normal pregnancy, unspecified, third trimester: Secondary | ICD-10-CM

## 2021-07-21 DIAGNOSIS — O26899 Other specified pregnancy related conditions, unspecified trimester: Secondary | ICD-10-CM

## 2021-07-21 DIAGNOSIS — O09899 Supervision of other high risk pregnancies, unspecified trimester: Secondary | ICD-10-CM

## 2021-07-21 DIAGNOSIS — Z3A29 29 weeks gestation of pregnancy: Secondary | ICD-10-CM

## 2021-07-21 NOTE — Progress Notes (Signed)
° ° °  Subjective:  Meagan Martin is a 21 y.o. G2P1001 at [redacted]w[redacted]d being seen today for ongoing prenatal care.  She is currently monitored for the following issues for this low-risk pregnancy and has Rh negative state in antepartum period; Supervision of low-risk pregnancy; Short interval between pregnancies affecting pregnancy, antepartum; and History of pre-eclampsia in prior pregnancy, currently pregnant in first trimester on their problem list.  Patient reports some vaginal pain only when walking. "Sharp and shooting" like in her canal and on the left side. Back pain at night has been doing better with exercises. Contractions: Not present. Vag. Bleeding: None.  Movement: Present. Denies leaking of fluid.   The following portions of the patient's history were reviewed and updated as appropriate: allergies, current medications, past family history, past medical history, past social history, past surgical history and problem list.   Objective:   Vitals:   07/21/21 0946  BP: 110/81  Pulse: 95  Weight: 145 lb (65.8 kg)    Fetal Status: Fetal Heart Rate (bpm): 132   Movement: Present     General:  Alert, oriented and cooperative. Patient is in no acute distress.  Skin: Skin is warm and dry. No rash noted.   Cardiovascular: Normal heart rate noted  Respiratory: Normal respiratory effort, no problems with respiration noted  Abdomen: Soft, gravid, appropriate for gestational age. Pain/Pressure: Present     Pelvic:  Cervical exam deferred        Extremities: Normal range of motion.  Edema: None  Mental Status: Normal mood and affect. Normal behavior. Normal judgment and thought content.    Assessment and Plan:  Pregnancy: G2P1001 at [redacted]w[redacted]d  1. Encounter for supervision of low-risk pregnancy in third trimester Doing well with normal fetal movement. Encouraged off putting pressure with belly band or tape.   2. [redacted] weeks gestation of pregnancy Normal fetal movement.   3. Rh negative  state in antepartum period Received RhoGAM last visit.   4. Short interval between pregnancies affecting pregnancy, antepartum   Preterm labor symptoms and general obstetric precautions including but not limited to vaginal bleeding, contractions, leaking of fluid and fetal movement were reviewed in detail with the patient. Please refer to After Visit Summary for other counseling recommendations.    Return in about 2 weeks (around 08/04/2021) for LROB.   Allayne Stack, DO

## 2021-07-21 NOTE — Progress Notes (Signed)
Patient reports vaginal pain when she is walking that started roughly 2 weeks ago

## 2021-08-05 ENCOUNTER — Other Ambulatory Visit: Payer: Self-pay

## 2021-08-05 ENCOUNTER — Encounter: Payer: Self-pay | Admitting: Student

## 2021-08-05 DIAGNOSIS — O26893 Other specified pregnancy related conditions, third trimester: Secondary | ICD-10-CM

## 2021-08-05 MED ORDER — FAMOTIDINE 20 MG PO TABS: 20.0000 mg | ORAL_TABLET | Freq: Two times a day (BID) | ORAL | 0 refills | Status: DC

## 2021-08-06 ENCOUNTER — Other Ambulatory Visit: Payer: Self-pay | Admitting: Lactation Services

## 2021-08-06 ENCOUNTER — Encounter: Payer: Self-pay | Admitting: Student

## 2021-08-06 MED ORDER — FAMOTIDINE 20 MG PO TABS: 20.0000 mg | ORAL_TABLET | Freq: Two times a day (BID) | ORAL | 0 refills | Status: DC

## 2021-08-06 NOTE — Progress Notes (Signed)
Changed Famotidine prescription to 90 day prescription at patient and pharmacy request.

## 2021-08-07 ENCOUNTER — Other Ambulatory Visit: Payer: Self-pay

## 2021-08-07 ENCOUNTER — Ambulatory Visit (INDEPENDENT_AMBULATORY_CARE_PROVIDER_SITE_OTHER): Payer: Medicaid Other | Admitting: Student

## 2021-08-07 VITALS — BP 114/80 | HR 72 | Wt 145.0 lb

## 2021-08-07 DIAGNOSIS — Z3493 Encounter for supervision of normal pregnancy, unspecified, third trimester: Secondary | ICD-10-CM

## 2021-08-07 DIAGNOSIS — O09291 Supervision of pregnancy with other poor reproductive or obstetric history, first trimester: Secondary | ICD-10-CM

## 2021-08-07 DIAGNOSIS — Z3A31 31 weeks gestation of pregnancy: Secondary | ICD-10-CM

## 2021-08-07 NOTE — Progress Notes (Signed)
° °  PRENATAL VISIT NOTE  Subjective:  Meagan Martin is a 21 y.o. G2P1001 at [redacted]w[redacted]d being seen today for ongoing prenatal care.  She is currently monitored for the following issues for this low-risk pregnancy and has Rh negative state in antepartum period; Supervision of low-risk pregnancy; Short interval between pregnancies affecting pregnancy, antepartum; and History of pre-eclampsia in prior pregnancy, currently pregnant in first trimester on their problem list.  Patient reports  some complaints with eating and feeling full, then followed by some SOB .  Contractions: Irritability. Vag. Bleeding: None.  Movement: Present. Denies leaking of fluid.   The following portions of the patient's history were reviewed and updated as appropriate: allergies, current medications, past family history, past medical history, past social history, past surgical history and problem list.   Objective:   Vitals:   08/07/21 0834  BP: 114/80  Pulse: 72  Weight: 145 lb (65.8 kg)    Fetal Status: Fetal Heart Rate (bpm): 144   Movement: Present     General:  Alert, oriented and cooperative. Patient is in no acute distress.  Skin: Skin is warm and dry. No rash noted.   Cardiovascular: Normal heart rate noted  Respiratory: Normal respiratory effort, no problems with respiration noted  Abdomen: Soft, gravid, appropriate for gestational age.  Pain/Pressure: Present     Pelvic: Cervical exam deferred        Extremities: Normal range of motion.  Edema: Trace  Mental Status: Normal mood and affect. Normal behavior. Normal judgment and thought content.   Assessment and Plan:  Pregnancy: G2P1001 at [redacted]w[redacted]d   1. History of pre-eclampsia in prior pregnancy, currently pregnant in first trimester   -keep taking baby ASA  -will draw baseline pre-e labs today -BP ok today -discussed how to prevent heartburn and SOB after eating, discussed remedies and timing of food vs. Water intake -confirmed patient got Rhogam  last visit and that she passed 2 hourGTT  Preterm labor symptoms and general obstetric precautions including but not limited to vaginal bleeding, contractions, leaking of fluid and fetal movement were reviewed in detail with the patient. Please refer to After Visit Summary for other counseling recommendations.   Return in about 2 weeks (around 08/21/2021), or for LROB with KK (if possible, otherwise keep the appt with Shawnie Pons).  Future Appointments  Date Time Provider Department Center  08/21/2021 11:15 AM Reva Bores, MD [facility]    Marylene Land, CNM

## 2021-08-08 LAB — PROTEIN / CREATININE RATIO, URINE
Creatinine, Urine: 112.7 mg/dL
Protein, Ur: 19.9 mg/dL
Protein/Creat Ratio: 177 mg/g creat (ref 0–200)

## 2021-08-08 LAB — COMPREHENSIVE METABOLIC PANEL
ALT: 7 IU/L (ref 0–32)
AST: 9 IU/L (ref 0–40)
Albumin/Globulin Ratio: 1.2 (ref 1.2–2.2)
Albumin: 3.9 g/dL (ref 3.9–5.0)
Alkaline Phosphatase: 198 IU/L — ABNORMAL HIGH (ref 44–121)
BUN/Creatinine Ratio: 10 (ref 9–23)
BUN: 4 mg/dL — ABNORMAL LOW (ref 6–20)
Bilirubin Total: <0.2 mg/dL (ref 0.0–1.2)
CO2: 20 mmol/L (ref 20–29)
Calcium: 9 mg/dL (ref 8.7–10.2)
Chloride: 102 mmol/L (ref 96–106)
Creatinine, Ser: 0.42 mg/dL — ABNORMAL LOW (ref 0.57–1.00)
Globulin, Total: 3.2 g/dL (ref 1.5–4.5)
Glucose: 84 mg/dL (ref 70–99)
Potassium: 3.8 mmol/L (ref 3.5–5.2)
Sodium: 137 mmol/L (ref 134–144)
Total Protein: 7.1 g/dL (ref 6.0–8.5)
eGFR: 142 mL/min/{1.73_m2} (ref >59)

## 2021-08-08 LAB — CBC
Hematocrit: 33.3 % — ABNORMAL LOW (ref 34.0–46.6)
Hemoglobin: 11 g/dL — ABNORMAL LOW (ref 11.1–15.9)
MCH: 29 pg (ref 26.6–33.0)
MCHC: 33 g/dL (ref 31.5–35.7)
MCV: 88 fL (ref 79–97)
Platelets: 161 10*3/uL (ref 150–450)
RBC: 3.79 x10E6/uL (ref 3.77–5.28)
RDW: 13.4 % (ref 11.7–15.4)
WBC: 6.9 10*3/uL (ref 3.4–10.8)

## 2021-08-11 ENCOUNTER — Other Ambulatory Visit: Payer: Self-pay

## 2021-08-11 ENCOUNTER — Inpatient Hospital Stay (HOSPITAL_COMMUNITY)
Admission: AD | Admit: 2021-08-11 | Discharge: 2021-08-11 | Disposition: A | Discharge status: Home / Self Care | Payer: Medicaid Other | Attending: Obstetrics & Gynecology | Admitting: Obstetrics & Gynecology

## 2021-08-11 ENCOUNTER — Encounter (HOSPITAL_COMMUNITY): Payer: Self-pay | Admitting: Obstetrics & Gynecology

## 2021-08-11 DIAGNOSIS — Z3A32 32 weeks gestation of pregnancy: Secondary | ICD-10-CM | POA: Insufficient documentation

## 2021-08-11 DIAGNOSIS — M9903 Segmental and somatic dysfunction of lumbar region: Secondary | ICD-10-CM

## 2021-08-11 DIAGNOSIS — Z3689 Encounter for other specified antenatal screening: Secondary | ICD-10-CM | POA: Insufficient documentation

## 2021-08-11 DIAGNOSIS — M9905 Segmental and somatic dysfunction of pelvic region: Secondary | ICD-10-CM | POA: Diagnosis not present

## 2021-08-11 DIAGNOSIS — X58XXXA Exposure to other specified factors, initial encounter: Secondary | ICD-10-CM | POA: Insufficient documentation

## 2021-08-11 DIAGNOSIS — S334XXA Traumatic rupture of symphysis pubis, initial encounter: Secondary | ICD-10-CM | POA: Insufficient documentation

## 2021-08-11 DIAGNOSIS — R102 Pelvic and perineal pain: Secondary | ICD-10-CM | POA: Insufficient documentation

## 2021-08-11 DIAGNOSIS — O09899 Supervision of other high risk pregnancies, unspecified trimester: Secondary | ICD-10-CM

## 2021-08-11 DIAGNOSIS — O26893 Other specified pregnancy related conditions, third trimester: Secondary | ICD-10-CM | POA: Insufficient documentation

## 2021-08-11 DIAGNOSIS — Z3493 Encounter for supervision of normal pregnancy, unspecified, third trimester: Secondary | ICD-10-CM

## 2021-08-11 NOTE — MAU Provider Note (Signed)
History     CSN: [id number]  Arrival date and time: 08/11/21 1439   Event Date/Time   First Provider Initiated Contact with Patient 08/11/21 1517      Chief Complaint  Patient presents with   Abdominal Pain   Meagan Martin is a 21 y.o. G2P1001 at 35w3dwho presents today with suprapubic pain that is worse with standing and walking. It is relieved by resting/laying down. She denies any contractions, VB or  LOF. She reports normal fetal movement.   Abdominal Pain This is a new problem. The current episode started in the past 7 days. The problem occurs intermittently. The problem is unchanged. The pain is located in the suprapubic region. The pain is at a severity of 9/10. The pain does not radiate. The symptoms are relieved by recumbency and being still. Treatments tried: maternity support belt. The treatment provided no improvement relief.   OB History     Gravida  2   Para  1   Term  1   Preterm      AB      Living  1      SAB      IAB      Ectopic      Multiple  0   Live Births  1           Past Medical History:  Diagnosis Date   Medical history non-contributory    Mild preeclampsia 09/29/2020    Past Surgical History:  Procedure Laterality Date   NO PAST SURGERIES      Family History  Problem Relation Age of Onset   Diabetes Mother    Diabetes Father    Cancer Maternal Grandmother        throat   Intellectual disability Neg Hx    Hypertension Neg Hx    Stroke Neg Hx     Social History   Tobacco Use   Smoking status: Never   Smokeless tobacco: Never  Vaping Use   Vaping Use: Never used  Substance Use Topics   Alcohol use: Never   Drug use: Never    Allergies: No Known Allergies  Medications Prior to Admission  Medication Sig Dispense Refill Last Dose   aspirin 81 MG chewable tablet Chew 1 tablet (81 mg total) by mouth daily. 90 tablet 1 08/11/2021   Prenatal Vit-Fe Fumarate-FA (PREPLUS) 27-1 MG TABS Take 1 tablet by mouth  daily.   08/11/2021   Blood Pressure Monitoring (BLOOD PRESSURE KIT) DEVI 1 Device by Other route as needed. (Patient not taking: Reported on 05/20/2021) 1 each 0    famotidine (PEPCID) 20 MG tablet Take 1 tablet (20 mg total) by mouth 2 (two) times daily. 180 tablet 0    Misc. Devices (GOJJI WEIGHT SCALE) MISC 1 Device by Does not apply route as needed. (Patient not taking: Reported on 03/10/2021) 1 each DEVICE    ondansetron (ZOFRAN ODT) 8 MG disintegrating tablet Take 1 tablet (8 mg total) by mouth every 8 (eight) hours as needed for nausea or vomiting. (Patient not taking: Reported on 05/20/2021) 30 tablet 0     Review of Systems  Gastrointestinal:  Positive for abdominal pain.  All other systems reviewed and are negative. Physical Exam   Blood pressure 112/72, pulse 89, temperature 97.9 F (36.6 C), temperature source Oral, resp. rate 17, height '5\' 1"'  (1.549 m), weight 66.5 kg, last menstrual period 12/27/2020, SpO2 100 %, currently breastfeeding.  Physical Exam Constitutional:  Appearance: She is well-developed.  HENT:     Head: Normocephalic.  Eyes:     Pupils: Pupils are equal, round, and reactive to light.  Cardiovascular:     Rate and Rhythm: Normal rate and regular rhythm.     Heart sounds: Normal heart sounds.  Pulmonary:     Effort: Pulmonary effort is normal. No respiratory distress.     Breath sounds: Normal breath sounds.  Abdominal:     Palpations: Abdomen is soft.     Tenderness: There is no abdominal tenderness.  Genitourinary:    Vagina: No bleeding. Vaginal discharge: mucusy.    Comments: External: no lesion Vagina: small amount of white discharge     Musculoskeletal:        General: Normal range of motion.     Cervical back: Normal range of motion and neck supple.  Skin:    General: Skin is warm and dry.  Neurological:     Mental Status: She is alert and oriented to person, place, and time.  Psychiatric:        Mood and Affect: Mood normal.         Behavior: Behavior normal.    NST:  Baseline: 135 Variability: moderate Accels: 15x15 Decels: none Toco: none Reactive/Appropriate for GA   MAU Course  Procedures  MDM Dr. Nehemiah Settle consulted for osteopathic manipulation  Assessment and Plan   1. Encounter for supervision of low-risk pregnancy in third trimester   2. Short interval between pregnancies affecting pregnancy, antepartum   3. Dislocation of symphysis pubis, initial encounter   4. [redacted] weeks gestation of pregnancy    DC home 3rd Trimester precautions  PTL precautions  Fetal kick counts RX: none  Return to MAU as needed FU with OB as planned Patient has a PT referral in the system, advised to make PT appt She is asking about starting maternity leave, and patient advised that they will need to complete the necessary paperwork from her prenatal provider's office. We can't complete that paperwork here in MAU. Gave a note for work Midwife.    Marcille Buffy DNP, CNM  08/11/21  4:18 PM

## 2021-08-11 NOTE — MAU Note (Signed)
...  Meagan Martin is a 21 y.o. at [redacted]w[redacted]d here in MAU reporting: Lower abdominal pain/pressure that has been occurring for "weeks" but states this past Friday the pains were so intense that she had to stay in bed all day. She states when she sits or lies down the pain goes away but when she walks or moves the pain returns. 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 She states she has tried wearing her pregnancy support belt as well as abdominal tape. +FM. No VB or LOF.   Pain score:  8/10 lower abdomen - with activity  FHT: 135 initial external Lab orders placed from triage: UA

## 2021-08-21 ENCOUNTER — Other Ambulatory Visit: Payer: Self-pay

## 2021-08-21 ENCOUNTER — Ambulatory Visit (INDEPENDENT_AMBULATORY_CARE_PROVIDER_SITE_OTHER): Payer: Medicaid Other

## 2021-08-21 VITALS — BP 117/78 | HR 72 | Wt 150.0 lb

## 2021-08-21 DIAGNOSIS — O26899 Other specified pregnancy related conditions, unspecified trimester: Secondary | ICD-10-CM

## 2021-08-21 DIAGNOSIS — Z3493 Encounter for supervision of normal pregnancy, unspecified, third trimester: Secondary | ICD-10-CM

## 2021-08-21 DIAGNOSIS — O09291 Supervision of pregnancy with other poor reproductive or obstetric history, first trimester: Secondary | ICD-10-CM

## 2021-08-21 DIAGNOSIS — Z6791 Unspecified blood type, Rh negative: Secondary | ICD-10-CM

## 2021-08-21 NOTE — Patient Instructions (Signed)

## 2021-08-21 NOTE — Progress Notes (Signed)
° °  PRENATAL VISIT NOTE  Subjective:  Meagan Martin is a 21 y.o. G2P1001 at [redacted]w[redacted]d being seen today for ongoing prenatal care.  She is currently monitored for the following issues for this low-risk pregnancy and has Rh negative state in antepartum period; Supervision of low-risk pregnancy; Short interval between pregnancies affecting pregnancy, antepartum; and History of pre-eclampsia in prior pregnancy, currently pregnant in first trimester on their problem list.  Patient reports no complaints.  Contractions: Not present. Vag. Bleeding: None.  Movement: Present. Denies leaking of fluid.   The following portions of the patient's history were reviewed and updated as appropriate: allergies, current medications, past family history, past medical history, past social history, past surgical history and problem list.   Objective:   Vitals:   08/21/21 1121  BP: 117/78  Pulse: 72  Weight: 150 lb (68 kg)    Fetal Status: Fetal Heart Rate (bpm): 134 Fundal Height: 33 cm Movement: Present     General:  Alert, oriented and cooperative. Patient is in no acute distress.  Skin: Skin is warm and dry. No rash noted.   Cardiovascular: Normal heart rate noted  Respiratory: Normal respiratory effort, no problems with respiration noted  Abdomen: Soft, gravid, appropriate for gestational age.  Pain/Pressure: Absent     Pelvic: Cervical exam deferred        Extremities: Normal range of motion.  Edema: None  Mental Status: Normal mood and affect. Normal behavior. Normal judgment and thought content.   Assessment and Plan:  Pregnancy: G2P1001 at [redacted]w[redacted]d 1. Encounter for supervision of low-risk pregnancy in third trimester - Routine OB. Doing well. No concerns - Anticipatory guidance for upcoming appointments provided  2. Rh negative state in antepartum period - S/p Rhogam  3. History of pre-eclampsia in prior pregnancy, currently pregnant in first trimester - BP normotensive. Asymptomatic - bASA  daily  Preterm labor symptoms and general obstetric precautions including but not limited to vaginal bleeding, contractions, leaking of fluid and fetal movement were reviewed in detail with the patient. Please refer to After Visit Summary for other counseling recommendations.   Return in 2 weeks (on 09/04/2021).  Future Appointments  Date Time Provider Department Center  09/05/2021 10:35 AM Kathlene Cote [facility]    Brand Males, CNM

## 2021-08-23 ENCOUNTER — Encounter: Payer: Self-pay | Admitting: Radiology

## 2021-09-05 ENCOUNTER — Other Ambulatory Visit (HOSPITAL_COMMUNITY)
Admission: RE | Admit: 2021-09-05 | Discharge: 2021-09-05 | Disposition: A | Discharge status: Home / Self Care | Payer: Medicaid Other | Source: Ambulatory Visit | Attending: Medical | Admitting: Medical

## 2021-09-05 ENCOUNTER — Other Ambulatory Visit: Payer: Self-pay

## 2021-09-05 ENCOUNTER — Ambulatory Visit (INDEPENDENT_AMBULATORY_CARE_PROVIDER_SITE_OTHER): Payer: Medicaid Other | Admitting: Medical

## 2021-09-05 VITALS — BP 113/79 | HR 81 | Wt 151.0 lb

## 2021-09-05 DIAGNOSIS — Z6791 Unspecified blood type, Rh negative: Secondary | ICD-10-CM

## 2021-09-05 DIAGNOSIS — Z3A36 36 weeks gestation of pregnancy: Secondary | ICD-10-CM

## 2021-09-05 DIAGNOSIS — O26899 Other specified pregnancy related conditions, unspecified trimester: Secondary | ICD-10-CM

## 2021-09-05 DIAGNOSIS — Z3493 Encounter for supervision of normal pregnancy, unspecified, third trimester: Secondary | ICD-10-CM

## 2021-09-05 DIAGNOSIS — O09291 Supervision of pregnancy with other poor reproductive or obstetric history, first trimester: Secondary | ICD-10-CM

## 2021-09-05 DIAGNOSIS — O09899 Supervision of other high risk pregnancies, unspecified trimester: Secondary | ICD-10-CM

## 2021-09-05 LAB — OB RESULTS CONSOLE GC/CHLAMYDIA: Gonorrhea: NEGATIVE

## 2021-09-05 NOTE — Progress Notes (Signed)
? ?  PRENATAL VISIT NOTE ? ?Subjective:  ?Meagan Martin is a 21 y.o. G2P1001 at [redacted]w[redacted]d being seen today for ongoing prenatal care.  She is currently monitored for the following issues for this low-risk pregnancy and has Rh negative state in antepartum period; Supervision of low-risk pregnancy; Short interval between pregnancies affecting pregnancy, antepartum; and History of pre-eclampsia in prior pregnancy, currently pregnant in first trimester on their problem list. ? ?Patient reports no complaints.  Contractions: Not present. Vag. Bleeding: None.  Movement: Present. Denies leaking of fluid.  ? ?The following portions of the patient's history were reviewed and updated as appropriate: allergies, current medications, past family history, past medical history, past social history, past surgical history and problem list.  ? ?Objective:  ? ?Vitals:  ? 09/05/21 1043  ?BP: 113/79  ?Pulse: 81  ?Weight: 151 lb (68.5 kg)  ? ? ?Fetal Status: Fetal Heart Rate (bpm): 134 Fundal Height: 35 cm Movement: Present    ? ?General:  Alert, oriented and cooperative. Patient is in no acute distress.  ?Skin: Skin is warm and dry. No rash noted.   ?Cardiovascular: Normal heart rate noted  ?Respiratory: Normal respiratory effort, no problems with respiration noted  ?Abdomen: Soft, gravid, appropriate for gestational age.  Pain/Pressure: Absent     ?Pelvic: Cervical exam deferred        ?Extremities: Normal range of motion.  Edema: Trace  ?Mental Status: Normal mood and affect. Normal behavior. Normal judgment and thought content.  ? ? ?Bedside US ?Patient declined cervical exam. ?Pt informed that the ultrasound is considered a limited OB ultrasound and is not intended to be a complete ultrasound exam.  Patient also informed that the ultrasound is not being completed with the intent of assessing for fetal or placental anomalies or any pelvic abnormalities.  Explained that the purpose of today?s ultrasound is to assess for   presentation and viability.  Patient acknowledges the purpose of the exam and the limitations of the study.   ?Results: cephalic presentation  ? ?Assessment and Plan:  ?Pregnancy: G2P1001 at [redacted]w[redacted]d ?1. Encounter for supervision of low-risk pregnancy in third trimester ?- Culture, beta strep (group b only) ?- Cervicovaginal ancillary only( [place]) ? ?2. History of pre-eclampsia in prior pregnancy, currently pregnant in third trimester ?- Normotensive today  ? ?3. Short interval between pregnancies affecting pregnancy, antepartum ? ?4. Rh negative state in antepartum period ?- Had Rhogam at 8 weeks and 27 weeks  ? ?5. [redacted] weeks gestation of pregnancy ? ?Preterm labor symptoms and general obstetric precautions including but not limited to vaginal bleeding, contractions, leaking of fluid and fetal movement were reviewed in detail with the patient. ?Please refer to After Visit Summary for other counseling recommendations.  ? ?Return in about 1 week (around 09/12/2021) for LOB, In-Person, any provider. ? ?Future Appointments  ?Date Time Provider [place]  ?09/12/2021  9:55 AM Griffin Basil, MD [facility]  ?09/26/2021 10:35 AM Griffin Basil, MD [facility]  ? ? ?Kerry Hough, PA-C ? ?

## 2021-09-08 LAB — CERVICOVAGINAL ANCILLARY ONLY
Chlamydia: NEGATIVE
Comment: NEGATIVE
Comment: NORMAL
Neisseria Gonorrhea: NEGATIVE

## 2021-09-09 LAB — CULTURE, BETA STREP (GROUP B ONLY): Strep Gp B Culture: NEGATIVE

## 2021-09-12 ENCOUNTER — Other Ambulatory Visit: Payer: Self-pay

## 2021-09-12 ENCOUNTER — Ambulatory Visit (INDEPENDENT_AMBULATORY_CARE_PROVIDER_SITE_OTHER): Payer: Medicaid Other | Admitting: Obstetrics and Gynecology

## 2021-09-12 VITALS — BP 121/84 | HR 73 | Wt 152.3 lb

## 2021-09-12 DIAGNOSIS — O26899 Other specified pregnancy related conditions, unspecified trimester: Secondary | ICD-10-CM

## 2021-09-12 DIAGNOSIS — Z6791 Unspecified blood type, Rh negative: Secondary | ICD-10-CM

## 2021-09-12 DIAGNOSIS — Z3493 Encounter for supervision of normal pregnancy, unspecified, third trimester: Secondary | ICD-10-CM

## 2021-09-12 DIAGNOSIS — Z3A37 37 weeks gestation of pregnancy: Secondary | ICD-10-CM

## 2021-09-12 NOTE — Progress Notes (Signed)
? ?  PRENATAL VISIT NOTE ? ?Subjective:  ?Meagan Martin is a 21 y.o. G2P1001 at [redacted]w[redacted]d being seen today for ongoing prenatal care.  She is currently monitored for the following issues for this low-risk pregnancy and has Rh negative state in antepartum period; Supervision of low-risk pregnancy; Short interval between pregnancies affecting pregnancy, antepartum; and History of pre-eclampsia in prior pregnancy, currently pregnant in first trimester on their problem list. ? ?Patient doing well with no acute concerns today. She reports no complaints.  Contractions: Irritability. Vag. Bleeding: None.  Movement: Present. Denies leaking of fluid.  ? ?The following portions of the patient's history were reviewed and updated as appropriate: allergies, current medications, past family history, past medical history, past social history, past surgical history and problem list. Problem list updated. ? ?Objective:  ? ?Vitals:  ? 09/12/21 1007  ?BP: 121/84  ?Pulse: 73  ?Weight: 152 lb 4.8 oz (69.1 kg)  ? ? ?Fetal Status: Fetal Heart Rate (bpm): 143 Fundal Height: 37 cm Movement: Present    ? ?General:  Alert, oriented and cooperative. Patient is in no acute distress.  ?Skin: Skin is warm and dry. No rash noted.   ?Cardiovascular: Normal heart rate noted  ?Respiratory: Normal respiratory effort, no problems with respiration noted  ?Abdomen: Soft, gravid, appropriate for gestational age.  Pain/Pressure: Present     ?Pelvic: Cervical exam deferred        ?Extremities: Normal range of motion.  Edema: Trace  ?Mental Status:  Normal mood and affect. Normal behavior. Normal judgment and thought content.  ? ?Assessment and Plan:  ?Pregnancy: G2P1001 at [redacted]w[redacted]d ? ?1. [redacted] weeks gestation of pregnancy ? ? ?2. Encounter for supervision of low-risk pregnancy in third trimester ?Continue routine care ? ?3. Rh negative state in antepartum period ?Treat after delivery ? ?Term labor symptoms and general obstetric precautions including but not  limited to vaginal bleeding, contractions, leaking of fluid and fetal movement were reviewed in detail with the patient. ? ?Please refer to After Visit Summary for other counseling recommendations.  ? ?Return in about 1 week (around 09/19/2021) for ROB, in person. ? ? ?Mariel Aloe, MD ?Faculty Attending ?[facility] ?  ?

## 2021-09-18 ENCOUNTER — Other Ambulatory Visit: Payer: Self-pay

## 2021-09-18 ENCOUNTER — Ambulatory Visit (INDEPENDENT_AMBULATORY_CARE_PROVIDER_SITE_OTHER): Payer: Medicaid Other | Admitting: Student

## 2021-09-18 VITALS — BP 127/95 | HR 78 | Wt 154.3 lb

## 2021-09-18 DIAGNOSIS — Z3A37 37 weeks gestation of pregnancy: Secondary | ICD-10-CM | POA: Diagnosis not present

## 2021-09-18 DIAGNOSIS — Z0289 Encounter for other administrative examinations: Secondary | ICD-10-CM

## 2021-09-18 DIAGNOSIS — Z3493 Encounter for supervision of normal pregnancy, unspecified, third trimester: Secondary | ICD-10-CM

## 2021-09-18 NOTE — Progress Notes (Addendum)
? ?  PRENATAL VISIT NOTE ? ?Subjective:  ?Meagan Martin is a 21 y.o. G2P1001 at [redacted]w[redacted]d being seen today for ongoing prenatal care.  She is currently monitored for the following issues for this low-risk pregnancy and has Rh negative state in antepartum period; Supervision of low-risk pregnancy; Short interval between pregnancies affecting pregnancy, antepartum; and History of pre-eclampsia in prior pregnancy, currently pregnant in first trimester on their problem list. ? ?Patient reports no complaints.  Contractions: Not present. Vag. Bleeding: None.  Movement: Present. Denies leaking of fluid.  ? ?The following portions of the patient's history were reviewed and updated as appropriate: allergies, current medications, past family history, past medical history, past social history, past surgical history and problem list.  ? ?Objective:  ? ?Vitals:  ? 09/18/21 0832 09/18/21 0911  ?BP: (!) 123/96 (!) 127/95  ?Pulse: 78 78  ?Weight: 154 lb 4.8 oz (70 kg)   ? ? ?Fetal Status: Fetal Heart Rate (bpm): 140 Fundal Height: 36 cm Movement: Present  Presentation: Vertex ? ?General:  Alert, oriented and cooperative. Patient is in no acute distress.  ?Skin: Skin is warm and dry. No rash noted.   ?Cardiovascular: Normal heart rate noted  ?Respiratory: Normal respiratory effort, no problems with respiration noted  ?Abdomen: Soft, gravid, appropriate for gestational age.  Pain/Pressure: Present     ?Pelvic: Cervical exam performed in the presence of a chaperone Dilation: Fingertip      ?Extremities: Normal range of motion.  Edema: Trace  ?Mental Status: Normal mood and affect. Normal behavior. Normal judgment and thought content.  ? ?Assessment and Plan:  ?Pregnancy: G2P1001 at [redacted]w[redacted]d ?1. [redacted] weeks gestation of pregnancy   ?2. Encounter for supervision of low-risk pregnancy in third trimester   ? ?-patient denies any HA at this time; diastolic is elevated and will recheck again at the end of this visit ?-patient strongly desires  not to be induced; patient was given strict pre-e precautions and when to come to [facility]. Will also draw pre-e labs today ?-she will have check BP tomorrow; she understands that she might have to be induced if GHTN is diagnosed tomorrow; at the same time; she did have elevated blood pressure outside of pregnancy at 8 weeks. At this point, difficult to differentiate between [facility] vs. GHTN, however patient has history of pre-eclampsia.  ? ?Preterm labor symptoms and general obstetric precautions including but not limited to vaginal bleeding, contractions, leaking of fluid and fetal movement were reviewed in detail with the patient. ?Please refer to After Visit Summary for other counseling recommendations.  ? ?Return tomorrow for BP check. ? ?Future Appointments  ?Date Time Provider [place]  ?09/19/2021 10:00 AM [facility] NURSE [facility]  ?09/26/2021 10:35 AM Griffin Basil, MD [facility] [place]  ? ? ?Starr Lake, CNM ? ?

## 2021-09-19 ENCOUNTER — Inpatient Hospital Stay (HOSPITAL_COMMUNITY)
Admission: AD | Admit: 2021-09-19 | Discharge: 2021-09-22 | DRG: 807 | Disposition: A | Discharge status: Home / Self Care | Payer: Medicaid Other | Attending: Obstetrics and Gynecology | Admitting: Obstetrics and Gynecology

## 2021-09-19 ENCOUNTER — Inpatient Hospital Stay (HOSPITAL_COMMUNITY): Payer: Medicaid Other | Admitting: Anesthesiology

## 2021-09-19 ENCOUNTER — Other Ambulatory Visit: Payer: Self-pay

## 2021-09-19 ENCOUNTER — Ambulatory Visit: Payer: Medicaid Other | Admitting: *Deleted

## 2021-09-19 ENCOUNTER — Encounter (HOSPITAL_COMMUNITY): Payer: Self-pay | Admitting: Obstetrics and Gynecology

## 2021-09-19 VITALS — BP 132/93 | HR 78 | Ht 61.0 in | Wt 150.8 lb

## 2021-09-19 DIAGNOSIS — O134 Gestational [pregnancy-induced] hypertension without significant proteinuria, complicating childbirth: Principal | ICD-10-CM | POA: Diagnosis present

## 2021-09-19 DIAGNOSIS — Z6791 Unspecified blood type, Rh negative: Secondary | ICD-10-CM

## 2021-09-19 DIAGNOSIS — Z975 Presence of (intrauterine) contraceptive device: Secondary | ICD-10-CM

## 2021-09-19 DIAGNOSIS — O139 Gestational [pregnancy-induced] hypertension without significant proteinuria, unspecified trimester: Secondary | ICD-10-CM

## 2021-09-19 DIAGNOSIS — O09899 Supervision of other high risk pregnancies, unspecified trimester: Principal | ICD-10-CM

## 2021-09-19 DIAGNOSIS — Z30430 Encounter for insertion of intrauterine contraceptive device: Secondary | ICD-10-CM

## 2021-09-19 DIAGNOSIS — O26893 Other specified pregnancy related conditions, third trimester: Secondary | ICD-10-CM | POA: Diagnosis present

## 2021-09-19 DIAGNOSIS — Z3A38 38 weeks gestation of pregnancy: Secondary | ICD-10-CM | POA: Diagnosis not present

## 2021-09-19 DIAGNOSIS — Z7982 Long term (current) use of aspirin: Secondary | ICD-10-CM | POA: Diagnosis not present

## 2021-09-19 DIAGNOSIS — Z0130 Encounter for examination of blood pressure without abnormal findings: Secondary | ICD-10-CM

## 2021-09-19 DIAGNOSIS — O09291 Supervision of pregnancy with other poor reproductive or obstetric history, first trimester: Secondary | ICD-10-CM

## 2021-09-19 LAB — COMPREHENSIVE METABOLIC PANEL
ALT: 8 IU/L (ref 0–32)
ALT: 10 U/L (ref 0–44)
AST: 11 IU/L (ref 0–40)
AST: 15 U/L (ref 15–41)
Albumin/Globulin Ratio: 1.3 (ref 1.2–2.2)
Albumin: 3.7 g/dL — ABNORMAL LOW (ref 3.9–5.0)
Albumin: 2.7 g/dL — ABNORMAL LOW (ref 3.5–5.0)
Alkaline Phosphatase: 281 IU/L — ABNORMAL HIGH (ref 44–121)
Alkaline Phosphatase: 237 U/L — ABNORMAL HIGH (ref 38–126)
Anion gap: 8 (ref 5–15)
BUN/Creatinine Ratio: 12 (ref 9–23)
BUN: 7 mg/dL (ref 6–20)
BUN: 5 mg/dL — ABNORMAL LOW (ref 6–20)
Bilirubin Total: 0.3 mg/dL (ref 0.0–1.2)
CO2: 19 mmol/L — ABNORMAL LOW (ref 20–29)
CO2: 19 mmol/L — ABNORMAL LOW (ref 22–32)
Calcium: 8.9 mg/dL (ref 8.7–10.2)
Calcium: 8.4 mg/dL — ABNORMAL LOW (ref 8.9–10.3)
Chloride: 106 mmol/L (ref 96–106)
Chloride: 112 mmol/L — ABNORMAL HIGH (ref 98–111)
Creatinine, Ser: 0.58 mg/dL (ref 0.57–1.00)
Creatinine, Ser: 0.5 mg/dL (ref 0.44–1.00)
GFR, Estimated: >60 mL/min (ref >60)
Globulin, Total: 2.9 g/dL (ref 1.5–4.5)
Glucose, Bld: 96 mg/dL (ref 70–99)
Glucose: 75 mg/dL (ref 70–99)
Potassium: 4.1 mmol/L (ref 3.5–5.2)
Potassium: 3.8 mmol/L (ref 3.5–5.1)
Sodium: 140 mmol/L (ref 134–144)
Sodium: 139 mmol/L (ref 135–145)
Total Bilirubin: 0.4 mg/dL (ref 0.3–1.2)
Total Protein: 6.6 g/dL (ref 6.0–8.5)
Total Protein: 6.4 g/dL — ABNORMAL LOW (ref 6.5–8.1)
eGFR: 131 mL/min/{1.73_m2} (ref >59)

## 2021-09-19 LAB — CBC
HCT: 32.1 % — ABNORMAL LOW (ref 36.0–46.0)
Hematocrit: 35.8 % (ref 34.0–46.6)
Hemoglobin: 11.5 g/dL (ref 11.1–15.9)
Hemoglobin: 9.9 g/dL — ABNORMAL LOW (ref 12.0–15.0)
MCH: 27 pg (ref 26.6–33.0)
MCH: 26.3 pg (ref 26.0–34.0)
MCHC: 32.1 g/dL (ref 31.5–35.7)
MCHC: 30.8 g/dL (ref 30.0–36.0)
MCV: 84 fL (ref 79–97)
MCV: 85.1 fL (ref 80.0–100.0)
Platelets: 149 10*3/uL — ABNORMAL LOW (ref 150–450)
Platelets: 145 10*3/uL — ABNORMAL LOW (ref 150–400)
RBC: 4.26 x10E6/uL (ref 3.77–5.28)
RBC: 3.77 MIL/uL — ABNORMAL LOW (ref 3.87–5.11)
RDW: 13.9 % (ref 11.7–15.4)
RDW: 14.3 % (ref 11.5–15.5)
WBC: 5.7 10*3/uL (ref 3.4–10.8)
WBC: 5.9 10*3/uL (ref 4.0–10.5)
nRBC: 0 % (ref 0.0–0.2)

## 2021-09-19 LAB — PROTEIN / CREATININE RATIO, URINE
Creatinine, Urine: 237.2 mg/dL
Creatinine, Urine: 51.76 mg/dL
Protein Creatinine Ratio: 0.17 mg/mg{Cre} — ABNORMAL HIGH (ref 0.00–0.15)
Protein, Ur: 40.5 mg/dL
Protein/Creat Ratio: 171 mg/g creat (ref 0–200)
Total Protein, Urine: 9 mg/dL

## 2021-09-19 LAB — TYPE AND SCREEN
ABO/RH(D): O NEG
Antibody Screen: NEGATIVE

## 2021-09-19 MED ORDER — LACTATED RINGERS IV SOLN: INTRAVENOUS | Status: DC

## 2021-09-19 MED ORDER — DIPHENHYDRAMINE HCL 50 MG/ML IJ SOLN: 12.5000 mg | INTRAMUSCULAR | Status: DC | PRN

## 2021-09-19 MED ORDER — OXYTOCIN-SODIUM CHLORIDE 30-0.9 UT/500ML-% IV SOLN
2.5000 [IU]/h | INTRAVENOUS | Status: DC
  Administered 2021-09-20: 2.5 [IU]/h via INTRAVENOUS
  Filled 2021-09-19: qty 500

## 2021-09-19 MED ORDER — TERBUTALINE SULFATE 1 MG/ML IJ SOLN
0.2500 mg | Freq: Once | INTRAMUSCULAR | Status: DC | PRN
Start: 2021-09-19 — End: 2021-09-20

## 2021-09-19 MED ORDER — EPHEDRINE 5 MG/ML INJ: 10.0000 mg | INTRAVENOUS | Status: DC | PRN

## 2021-09-19 MED ORDER — OXYTOCIN BOLUS FROM INFUSION
333.0000 mL | Freq: Once | INTRAVENOUS | Status: AC
  Administered 2021-09-20: 333 mL via INTRAVENOUS

## 2021-09-19 MED ORDER — OXYCODONE-ACETAMINOPHEN 5-325 MG PO TABS: 1.0000 | ORAL_TABLET | ORAL | Status: DC | PRN

## 2021-09-19 MED ORDER — MISOPROSTOL 50MCG HALF TABLET
50.0000 ug | ORAL_TABLET | ORAL | Status: DC | PRN
Start: 2021-09-19 — End: 2021-09-20
  Administered 2021-09-19 (×2): 50 ug via BUCCAL
  Filled 2021-09-19: qty 1

## 2021-09-19 MED ORDER — LACTATED RINGERS IV SOLN: 500.0000 mL | INTRAVENOUS | Status: DC | PRN

## 2021-09-19 MED ORDER — ACETAMINOPHEN 325 MG PO TABS: 650.0000 mg | ORAL_TABLET | ORAL | Status: DC | PRN

## 2021-09-19 MED ORDER — SOD CITRATE-CITRIC ACID 500-334 MG/5ML PO SOLN: 30.0000 mL | ORAL | Status: DC | PRN

## 2021-09-19 MED ORDER — FENTANYL-BUPIVACAINE-NACL 0.5-0.125-0.9 MG/250ML-% EP SOLN
12.0000 mL/h | EPIDURAL | Status: DC | PRN
  Administered 2021-09-19: 12 mL/h via EPIDURAL
  Filled 2021-09-19: qty 250

## 2021-09-19 MED ORDER — LIDOCAINE HCL (PF) 1 % IJ SOLN: 30.0000 mL | INTRAMUSCULAR | Status: DC | PRN

## 2021-09-19 MED ORDER — FLEET ENEMA 7-19 GM/118ML RE ENEM: 1.0000 | ENEMA | RECTAL | Status: DC | PRN

## 2021-09-19 MED ORDER — PHENYLEPHRINE 40 MCG/ML (10ML) SYRINGE FOR IV PUSH (FOR BLOOD PRESSURE SUPPORT): 80.0000 ug | PREFILLED_SYRINGE | INTRAVENOUS | Status: DC | PRN

## 2021-09-19 MED ORDER — LIDOCAINE HCL (PF) 1 % IJ SOLN
INTRAMUSCULAR | Status: DC | PRN
  Administered 2021-09-19: 6 mL via EPIDURAL

## 2021-09-19 MED ORDER — LEVONORGESTREL 20.1 MCG/DAY IU IUD
1.0000 | INTRAUTERINE_SYSTEM | Freq: Once | INTRAUTERINE | Status: AC
Start: 2021-09-19 — End: 2021-09-20
  Administered 2021-09-20: 1 via INTRAUTERINE
  Filled 2021-09-19: qty 1

## 2021-09-19 MED ORDER — MISOPROSTOL 25 MCG QUARTER TABLET
25.0000 ug | ORAL_TABLET | ORAL | Status: DC | PRN
  Filled 2021-09-19 (×2): qty 1

## 2021-09-19 MED ORDER — OXYCODONE-ACETAMINOPHEN 5-325 MG PO TABS: 2.0000 | ORAL_TABLET | ORAL | Status: DC | PRN

## 2021-09-19 MED ORDER — LACTATED RINGERS IV SOLN: 500.0000 mL | Freq: Once | INTRAVENOUS | Status: DC

## 2021-09-19 MED ORDER — ONDANSETRON HCL 4 MG/2ML IJ SOLN: 4.0000 mg | Freq: Four times a day (QID) | INTRAMUSCULAR | Status: DC | PRN

## 2021-09-19 NOTE — H&P (Addendum)
OBSTETRIC ADMISSION HISTORY AND PHYSICAL ? ?Meagan Martin is a 21 y.o. female G2P1001 with IUP at 4w0dby LMP presenting for IOL due to gHope She [facility] but on arrival to L&D she reported it was no longer present. She reports +FMs, No LOF, no VB, no blurry vision, peripheral edema, or RUQ pain. She plans on breast feeding. She request post-placental IUD for birth control. ?[facility]  ? ?Dating: By LMP --->  Estimated Date of Delivery: 10/03/21 ? ?Sono:   ? ?_0 , CWD, normal anatomy, cephalic presentation, 2295A 71 % EFW ? ? ?Prenatal History/Complications:  ?-gHTN (history of preeclampsia in prior pregnancy) ?-Rh negative state ?-Short interval between pregnancies ? ?Past Medical History: ?Past Medical History:  ?Diagnosis Date  ? Medical history non-contributory   ? Mild preeclampsia 09/29/2020  ? ? ?Past Surgical History: ?Past Surgical History:  ?Procedure Laterality Date  ? NO PAST SURGERIES    ? ? ?Obstetrical History: ?OB History   ? ? Gravida  ?2  ? Para  ?1  ? Term  ?1  ? Preterm  ?   ? AB  ?   ? Living  ?1  ?  ? ? SAB  ?   ? IAB  ?   ? Ectopic  ?   ? Multiple  ?0  ? Live Births  ?1  ?   ?  ?  ? ? ?Social History ?Social History  ? ?Socioeconomic History  ? Marital status: Married  ?  Spouse name: Not on file  ? Number of children: Not on file  ? Years of education: Not on file  ? Highest education level: Some college, no degree  ?Occupational History  ? Occupation: student  ?Tobacco Use  ? Smoking status: Never  ? Smokeless tobacco: Never  ?Vaping Use  ? Vaping Use: Never used  ?Substance and Sexual Activity  ? Alcohol use: Never  ? Drug use: Never  ? Sexual activity: Yes  ?Other Topics Concern  ? Not on file  ?Social History Narrative  ? Not on file  ? ?Social Determinants of Health  ? ?Financial Resource Strain: Not on file  ?Food Insecurity: No Food Insecurity  ? Worried About RCharity fundraiserin the  Last Year: Never true  ? Ran Out of Food in the Last Year: Never true  ?Transportation Needs: No Transportation Needs  ? Lack of Transportation (Medical): No  ? Lack of Transportation (Non-Medical): No  ?Physical Activity: Not on file  ?Stress: Not on file  ?Social Connections: Not on file  ? ? ?Family History: ?Family History  ?Problem Relation Age of Onset  ? Diabetes Mother   ? Diabetes Father   ? Cancer Maternal Grandmother   ?     throat  ? Intellectual disability Neg Hx   ? Hypertension Neg Hx   ? Stroke Neg Hx   ? ? ?Allergies: ?No Known Allergies ? ?Medications Prior to Admission  ?Medication Sig Dispense Refill Last Dose  ? aspirin 81 MG chewable tablet Chew 1 tablet (81 mg total) by mouth daily. 90 tablet 1 09/18/2021  ? famotidine (PEPCID) 20 MG tablet Take 1 tablet (20 mg total) by mouth 2 (two) times daily. 180 tablet 0 09/19/2021  ? ondansetron (ZOFRAN ODT) 8 MG disintegrating tablet Take 1 tablet (8 mg total) by mouth every 8 (eight) hours as needed for nausea or vomiting. 3Harvey  tablet 0 Past Month  ? Prenatal Vit-Fe Fumarate-FA (PREPLUS) 27-1 MG TABS Take 1 tablet by mouth daily.   09/18/2021  ? Blood Pressure Monitoring (BLOOD PRESSURE KIT) DEVI 1 Device by Other route as needed. 1 each 0   ? Misc. Devices (GOJJI WEIGHT SCALE) MISC 1 Device by Does not apply route as needed. (Patient not taking: Reported on 03/10/2021) 1 each DEVICE   ? ? ? ?Review of Systems  ? ?All systems reviewed and negative except as stated in HPI ? ?Blood pressure (!) 130/99, pulse 83, temperature 97.8 ?F (36.6 ?C), temperature source Oral, resp. rate 16, height _0  (1.549 m), weight 71.8 kg, last menstrual period 12/27/2020, currently breastfeeding. ?General appearance: alert, cooperative, and appears stated age ?Lungs: no increased WOB ?Heart: regular rate  ?Abdomen: soft, non-tender ?Extremities: no sign of DVT ?Presentation: cephalic confirmed by bedside U/S ?Fetal monitoring Baseline: 140 bpm, Variability: Fair (1-6 bpm), and  Accelerations: Reactive. No decelerations ?Uterine activityFrequency: occasional ?Dilation: 1 ?Effacement (%): 50 ?Station: -3 ?Exam by:: Dr. Herb Grays ? ? ?Prenatal labs: ?ABO, Rh: --/--/PENDING (03/24 1538) ?Antibody: PENDING (03/24 1538) ?Rubella: 3.31 (09/12 1530) ?RPR: Non Reactive (01/10 0858)  ?HBsAg: Negative (09/12 1530)  ?HIV: Non Reactive (01/10 0858)  ?GBS: Negative/-- (03/10 1119)  ?2 hr Glucola normal (01/10 0900) ?Genetic screening NIPS low risk, horizon neg (09/12 1006) ?Anatomy US normal female (11/11 1112) ? ?Prenatal Transfer Tool  ?Maternal Diabetes: No ?Genetic Screening: Normal ?Maternal Ultrasounds/Referrals: Normal ?Fetal Ultrasounds or other Referrals:  None ?Maternal Substance Abuse:  No ?Significant Maternal Medications:  None ?Significant Maternal Lab Results: Group B Strep negative and Rh negative ? ?Results for orders placed or performed during the hospital encounter of 09/19/21 (from the past 24 hour(s))  ?Type and screen [place]  ? Collection Time: 09/19/21  3:38 PM  ?Result Value Ref Range  ? ABO/RH(D) PENDING   ? Antibody Screen PENDING   ? Sample Expiration    ?  09/22/2021,2359 ?Performed at [facility], [place] [address] [place], [place] [address] ?  ?CBC  ? Collection Time: 09/19/21  4:15 PM  ?Result Value Ref Range  ? WBC 5.9 4.0 - 10.5 K/uL  ? RBC 3.77 (L) 3.87 - 5.11 MIL/uL  ? Hemoglobin 9.9 (L) 12.0 - 15.0 g/dL  ? HCT 32.1 (L) 36.0 - 46.0 %  ? MCV 85.1 80.0 - 100.0 fL  ? MCH 26.3 26.0 - 34.0 pg  ? MCHC 30.8 30.0 - 36.0 g/dL  ? RDW 14.3 11.5 - 15.5 %  ? Platelets 145 (L) 150 - 400 K/uL  ? nRBC 0.0 0.0 - 0.2 %  ? ? ?Patient Active Problem List  ? Diagnosis Date Noted  ? Gestational hypertension 09/19/2021  ? History of pre-eclampsia in prior pregnancy, currently pregnant in first trimester 03/10/2021  ? Supervision of low-risk pregnancy 02/26/2021  ? Short interval between pregnancies affecting pregnancy, antepartum 02/26/2021  ? Rh negative state in  antepartum period 02/24/2021  ? ? ?Assessment/Plan:  ?Zariya Janann Martin is a 21 y.o. G2P1001 at 78w0dhere for IOL due to gHTN today. ? ?#Labor: IOL starting with buccal Cytotec. Plan to recheck in 4 hours. Consider FB if amenable on next check.  ?#Pain: Plan for epidural and PRN in meantime ?#FWB: Category 1 ?#ID: GBS negative ?#MOF: breast ?#MOC: post-placental IUD, desires liletta IUD. Discussed risks and benefits associated with this (nature of post-placental placement, irregular bleeding, rare but possible implantation into uterine muscle/beyond). Signed consent.  ?#  Circ: does not desire inpatient ? ?#gHTN: BP mild range only, >4 hours apart. Did have headache in clinic today that has resolved without treatment ?-Obtain pre-e labs ?-Serial Bps ?-Monitor for symptoms ? ?#RH negative: RhoGAM eval pp.  ? ?Chevis Pretty, Student-PA  ?09/19/2021, 4:38 PM ? ?GME ATTESTATION:  ?I saw and evaluated the patient. I agree with the findings and the plan of care as documented in the student?s note. ? ?Darrelyn Hillock, DO ?OB Fellow, Faculty Practice ?[place] for [facility] ?09/19/2021 7:09 PM  ?  ?

## 2021-09-19 NOTE — Progress Notes (Signed)
Pt here for BP check following elevated BP in office yesterday. BP - 132/93, P - 78.  She reports having mild H/A today - pain scale 2.  She denies all visual disturbances.  PIH labs were drawn yesterday morning and results are not yet available. Pt status discussed with Dr. Crissie Reese who recommends direct admit for IOL today.  Pt and spouse were informed of plan of care. Pt stated she has not eaten since last night. She was advised to eat a small meal and then proceed to [facility].  She voiced understanding and agreed to plan of care.     ?

## 2021-09-19 NOTE — Progress Notes (Signed)
Patient ID: Meagan Martin, female   DOB: Mar 31, 2001, 21 y.o.   MRN: [id number] ? ?S/p cytotec x 1 dose; having some mild cramping per her report; no s/s pre-e ? ?BPs 127/89, 133/97 ?FHR 130-140s, +accels, no decels ?Ctx irreg, mild ?Cx 1+/60%/soft/vtx -2 ? ?P/C: 0.17 ? ?IUP@38 .0wks ?gHTN ?Cx unfavorable ? ?Cervical foley inserted and inflated with 30cc fluid; 2nd dose of buccal cytotec given ?Anticipate being able to start Pit in 4hrs ?Anticipate vag del ? ?[name] CNM ?09/19/2021 ?9:12 PM ? ?

## 2021-09-19 NOTE — Anesthesia Preprocedure Evaluation (Signed)
Anesthesia Evaluation  Patient identified by MRN, date of birth, ID band Patient awake    Reviewed: Allergy & Precautions, NPO status , Patient's Chart, lab work & pertinent test results  Airway Mallampati: II  TM Distance: >3 FB Neck ROM: Full    Dental no notable dental hx.    Pulmonary neg pulmonary ROS,    Pulmonary exam normal breath sounds clear to auscultation       Cardiovascular negative cardio ROS Normal cardiovascular exam Rhythm:Regular Rate:Normal     Neuro/Psych negative neurological ROS  negative psych ROS   GI/Hepatic negative GI ROS, Neg liver ROS,   Endo/Other  negative endocrine ROS  Renal/GU negative Renal ROS  negative genitourinary   Musculoskeletal negative musculoskeletal ROS (+)   Abdominal   Peds negative pediatric ROS (+)  Hematology  (+) Blood dyscrasia, anemia ,   Anesthesia Other Findings   Reproductive/Obstetrics (+) Pregnancy                             Anesthesia Physical Anesthesia Plan  ASA: 2  Anesthesia Plan: Epidural   Post-op Pain Management:    Induction:   PONV Risk Score and Plan: 2 and Treatment may vary due to age or medical condition  Airway Management Planned: Natural Airway  Additional Equipment: None  Intra-op Plan:   Post-operative Plan:   Informed Consent: I have reviewed the patients History and Physical, chart, labs and discussed the procedure including the risks, benefits and alternatives for the proposed anesthesia with the patient or authorized representative who has indicated his/her understanding and acceptance.       Plan Discussed with: Anesthesiologist  Anesthesia Plan Comments:         Anesthesia Quick Evaluation  

## 2021-09-19 NOTE — Anesthesia Procedure Notes (Signed)
Epidural ?Patient location during procedure: OB ?Start time: 09/19/2021 10:10 PM ?End time: 09/19/2021 10:20 PM ? ?Staffing ?Anesthesiologist: Merlinda Frederick, MD ?Performed: anesthesiologist  ? ?Preanesthetic Checklist ?Completed: patient identified, IV checked, site marked, risks and benefits discussed, monitors and equipment checked, pre-op evaluation and timeout performed ? ?Epidural ?Patient position: sitting ?Prep: DuraPrep ?Patient monitoring: heart rate, cardiac monitor, continuous pulse ox and blood pressure ?Approach: midline ?Location: L2-L3 ?Injection technique: LOR saline ? ?Needle:  ?Needle type: Tuohy  ?Needle gauge: 17 G ?Needle length: 9 cm ?Needle insertion depth: 4 cm ?Catheter type: closed end flexible ?Catheter size: 20 Guage ?Catheter at skin depth: 9 cm ?Test dose: negative and Other ? ?Assessment ?Events: blood not aspirated, injection not painful, no injection resistance and negative IV test ? ?Additional Notes ?Informed consent obtained prior to proceeding including risk of failure, 1% risk of PDPH, risk of minor discomfort and bruising.  Discussed rare but serious complications including epidural abscess, permanent nerve injury, epidural hematoma.  Discussed alternatives to epidural analgesia and patient desires to proceed.  Timeout performed pre-procedure verifying patient name, procedure, and platelet count.  Patient tolerated procedure well. ? ? ? ? ?

## 2021-09-20 ENCOUNTER — Encounter (HOSPITAL_COMMUNITY): Payer: Self-pay | Admitting: Obstetrics and Gynecology

## 2021-09-20 DIAGNOSIS — Z30430 Encounter for insertion of intrauterine contraceptive device: Secondary | ICD-10-CM

## 2021-09-20 DIAGNOSIS — Z3A38 38 weeks gestation of pregnancy: Secondary | ICD-10-CM

## 2021-09-20 DIAGNOSIS — O134 Gestational [pregnancy-induced] hypertension without significant proteinuria, complicating childbirth: Secondary | ICD-10-CM

## 2021-09-20 LAB — RPR: RPR Ser Ql: NONREACTIVE

## 2021-09-20 LAB — BIRTH TISSUE RECOVERY COLLECTION (PLACENTA DONATION)

## 2021-09-20 MED ORDER — IBUPROFEN 600 MG PO TABS
600.0000 mg | ORAL_TABLET | Freq: Four times a day (QID) | ORAL | Status: DC
  Administered 2021-09-20 – 2021-09-22 (×9): 600 mg via ORAL
  Filled 2021-09-20 (×9): qty 1

## 2021-09-20 MED ORDER — MISOPROSTOL 200 MCG PO TABS
400.0000 ug | ORAL_TABLET | Freq: Once | ORAL | Status: AC
  Administered 2021-09-20: 400 ug via ORAL

## 2021-09-20 MED ORDER — BENZOCAINE-MENTHOL 20-0.5 % EX AERO
1.0000 "application " | INHALATION_SPRAY | CUTANEOUS | Status: DC | PRN
  Administered 2021-09-21: 1 via TOPICAL
  Filled 2021-09-20: qty 56

## 2021-09-20 MED ORDER — WITCH HAZEL-GLYCERIN EX PADS: 1.0000 "application " | MEDICATED_PAD | CUTANEOUS | Status: DC | PRN

## 2021-09-20 MED ORDER — FUROSEMIDE 20 MG PO TABS
20.0000 mg | ORAL_TABLET | Freq: Every day | ORAL | Status: DC
  Administered 2021-09-21 – 2021-09-22 (×2): 20 mg via ORAL
  Filled 2021-09-20 (×2): qty 1

## 2021-09-20 MED ORDER — DIPHENHYDRAMINE HCL 25 MG PO CAPS: 25.0000 mg | ORAL_CAPSULE | Freq: Four times a day (QID) | ORAL | Status: DC | PRN

## 2021-09-20 MED ORDER — RHO D IMMUNE GLOBULIN 1500 UNIT/2ML IJ SOSY
300.0000 ug | PREFILLED_SYRINGE | Freq: Once | INTRAMUSCULAR | Status: AC
  Administered 2021-09-20: 300 ug via INTRAVENOUS
  Filled 2021-09-20: qty 2

## 2021-09-20 MED ORDER — SIMETHICONE 80 MG PO CHEW
80.0000 mg | CHEWABLE_TABLET | ORAL | Status: DC | PRN
  Filled 2021-09-20: qty 1

## 2021-09-20 MED ORDER — ZOLPIDEM TARTRATE 5 MG PO TABS: 5.0000 mg | ORAL_TABLET | Freq: Every evening | ORAL | Status: DC | PRN

## 2021-09-20 MED ORDER — COCONUT OIL OIL
1.0000 "application " | TOPICAL_OIL | Status: DC | PRN
  Administered 2021-09-21: 1 via TOPICAL

## 2021-09-20 MED ORDER — ONDANSETRON HCL 4 MG/2ML IJ SOLN: 4.0000 mg | INTRAMUSCULAR | Status: DC | PRN

## 2021-09-20 MED ORDER — MEASLES, MUMPS & RUBELLA VAC IJ SOLR: 0.5000 mL | Freq: Once | INTRAMUSCULAR | Status: DC

## 2021-09-20 MED ORDER — PRENATAL MULTIVITAMIN CH
1.0000 | ORAL_TABLET | Freq: Every day | ORAL | Status: DC
  Administered 2021-09-20 – 2021-09-22 (×3): 1 via ORAL
  Filled 2021-09-20 (×3): qty 1

## 2021-09-20 MED ORDER — SENNOSIDES-DOCUSATE SODIUM 8.6-50 MG PO TABS
2.0000 | ORAL_TABLET | ORAL | Status: DC
Start: 2021-09-20 — End: 2021-09-22
  Administered 2021-09-20 – 2021-09-22 (×3): 2 via ORAL
  Filled 2021-09-20 (×3): qty 2

## 2021-09-20 MED ORDER — TETANUS-DIPHTH-ACELL PERTUSSIS 5-2.5-18.5 LF-MCG/0.5 IM SUSY: 0.5000 mL | PREFILLED_SYRINGE | Freq: Once | INTRAMUSCULAR | Status: DC

## 2021-09-20 MED ORDER — MISOPROSTOL 200 MCG PO TABS
ORAL_TABLET | ORAL | Status: AC
  Filled 2021-09-20: qty 4

## 2021-09-20 MED ORDER — ACETAMINOPHEN 325 MG PO TABS
650.0000 mg | ORAL_TABLET | ORAL | Status: DC | PRN
  Administered 2021-09-20: 650 mg via ORAL
  Filled 2021-09-20: qty 2

## 2021-09-20 MED ORDER — METHYLERGONOVINE MALEATE 0.2 MG PO TABS
0.2000 mg | ORAL_TABLET | ORAL | Status: DC
  Administered 2021-09-20 (×3): 0.2 mg via ORAL
  Filled 2021-09-20 (×3): qty 1

## 2021-09-20 MED ORDER — DIBUCAINE (PERIANAL) 1 % EX OINT: 1.0000 "application " | TOPICAL_OINTMENT | CUTANEOUS | Status: DC | PRN

## 2021-09-20 MED ORDER — OXYCODONE HCL 5 MG PO TABS: 5.0000 mg | ORAL_TABLET | ORAL | Status: DC | PRN

## 2021-09-20 MED ORDER — METHYLERGONOVINE MALEATE 0.2 MG/ML IJ SOLN: 0.2000 mg | INTRAMUSCULAR | Status: DC

## 2021-09-20 MED ORDER — MISOPROSTOL 200 MCG PO TABS
400.0000 ug | ORAL_TABLET | Freq: Once | ORAL | Status: AC
  Administered 2021-09-20: 400 ug via RECTAL

## 2021-09-20 MED ORDER — ONDANSETRON HCL 4 MG PO TABS: 4.0000 mg | ORAL_TABLET | ORAL | Status: DC | PRN

## 2021-09-20 NOTE — Progress Notes (Signed)
Spoke with MD about previous B/P 's , H/A remains 3/10 after Tylenol given. Motrin also given. No new order received . MD stated if H/A not relieved after Motrin, to call back with update. Report given to oncoming RN  ?

## 2021-09-20 NOTE — Progress Notes (Addendum)
Patient ID: Meagan Martin, female   DOB: 06-Sep-2000, 21 y.o.   MRN: [id number] ? ?Mostly comfortable w epidural, but is having pressure/urge to push w contractions; cervical foley came out after <1hr and also had SROM; has been complete since 0035 ? ?BPs 129/96, 128/94 ?FHR 140s, +accels, early variables ?Ctx q 3 mins ?Cx C/C/vtx -1/0 ? ?IUP@38 .1wk ?gHTN ?End 1st stage ? ?Begin pushing w ctx ?Anticipate vag del ? ?[name] CNM ?09/20/2021 ?1:13 AM ? ?

## 2021-09-20 NOTE — Lactation Note (Signed)
This note was copied from a baby's chart. ?Lactation Consultation Note ?Assisted baby in cradle position to latch. ?Baby having difficulty latching. When he does it is painful. ?Baby is off and on frequently. ?Mom stated her 23 yr old she BF for 1 month had a tongue tie. Mom was BF/formula feeding. ?Mom hopes to just BF w/this baby. ?Will assist mom when she comes to [facility]. ? ?Patient Name: Meagan Martin ?Today's Date: 09/20/2021 ?Reason for consult: L&D Initial assessment;Early term 37-38.6wks ?Age:78 hours ? ?Maternal Data ?  ? ?Feeding ?  ? ?LATCH Score ?Latch: Repeated attempts needed to sustain latch, nipple held in mouth throughout feeding, stimulation needed to elicit sucking reflex. ? ?Audible Swallowing: None ? ?Type of Nipple: Everted at rest and after stimulation (very short shaft/ stated needed NS w/her daughter) ? ?Comfort (Breast/Nipple): Soft / non-tender ? ?Hold (Positioning): Assistance needed to correctly position infant at breast and maintain latch. ? ?LATCH Score: 6 ? ? ?Lactation Tools Discussed/Used ?  ? ?Interventions ?Interventions: Assisted with latch;Skin to skin;Breast compression;Support pillows;Position options ? ?Discharge ?  ? ?Consult Status ?Consult Status: Follow-up from L&D ?Date: 09/20/21 ?Follow-up type: In-patient ? ? ? ?Charyl Dancer ?09/20/2021, 2:13 AM ? ? ? ?

## 2021-09-20 NOTE — Anesthesia Postprocedure Evaluation (Signed)
Anesthesia Post Note ? ?Patient: Meagan Martin ? ?Procedure(s) Performed: AN AD HOC LABOR EPIDURAL ? ?  ? ?Patient location during evaluation: Mother Baby ?Anesthesia Type: Epidural ?Level of consciousness: awake and alert ?Pain management: pain level controlled ?Vital Signs Assessment: post-procedure vital signs reviewed and stable ?Respiratory status: spontaneous breathing, nonlabored ventilation and respiratory function stable ?Cardiovascular status: stable ?Postop Assessment: no headache, no backache and epidural receding ?Anesthetic complications: no ? ? ?No notable events documented. ? ?Last Vitals:  ?Vitals:  ? 09/20/21 1450 09/20/21 1712  ?BP: (!) 125/96 (!) 138/99  ?Pulse: 85 69  ?Resp:  18  ?Temp:  36.7 ?C  ?SpO2:  100%  ?  ?Last Pain:  ?Vitals:  ? 09/20/21 1849  ?TempSrc:   ?PainSc: 3   ? ?Pain Goal:   ? ?  ?  ?  ?  ?  ?  ?  ? ?Aryaan Persichetti ? ? ? ? ?

## 2021-09-20 NOTE — Discharge Summary (Signed)
Postpartum Discharge Summary ?   ?Patient Name: Meagan Martin ?DOB: [birth year] ?MRN: [id number] ? ?Date of admission: 09/19/2021 ?Delivery date:09/20/2021  ?Delivering provider: Serita Grammes D  ?Date of discharge: 09/22/2021 ? ?Admitting diagnosis: Gestational hypertension [O13.9] ?Intrauterine pregnancy: [redacted]w[redacted]d    ?Secondary diagnosis:  Principal Problem: ?  Gestational hypertension ?Active Problems: ?  Rh negative state in antepartum period ?  Short interval between pregnancies affecting pregnancy, antepartum ?  History of pre-eclampsia in prior pregnancy, currently pregnant in first trimester ? ?Additional problems: none    ?Discharge diagnosis: Term Pregnancy Delivered and Gestational Hypertension                                              ?Postpartum procedures: postplacental IUD placement; Rhogam ?Augmentation: Cytotec and IP Foley ?Complications: None ? ?Hospital course: Induction of Labor With Vaginal Delivery   ?21y.o. yo G2P1001 at 384w1das admitted to the hospital 09/19/2021 for induction of labor.  Indication for induction: Gestational hypertension after having a mild H/A during her prenatal visit as well as mild range BP elevations; [facility]. Her pre-e labs were negative.  Patient had an uncomplicated labor course as follows, including a postplacental Liletta placement which she tolerated well (see procedure note). ?Membrane Rupture Time/Date: 9:48 PM ,09/19/2021   ?Delivery Method:Vaginal, Spontaneous  ?Episiotomy: None  ?Lacerations:  None  ?Details of delivery can be found in separate delivery note.  Patient had a routine postpartum course. She was started on 3060mrocardia and a short course of lasix. Patient is discharged home 09/22/21. ? ?Newborn Data: ?Birth date:09/20/2021  ?Birth time:1:31 AM  ?Gender:Female  ?Living status:Living  ?Apgars:9 ,9  ?Weight:6 lb 4.7 oz (2.855 kg) (6lb 4.7oz) ? ?Magnesium Sulfate received: No ?BMZ received:  No ?Rhophylac:Yes ?MMR:N/A ?T-DaP:Given prenatally ?Flu: Yes ?Transfusion:No ? ?Physical exam  ?Vitals:  ? 09/21/21 1200 09/21/21 1518 09/21/21 2134 09/22/21 0600  ?BP: 104/65 121/86 107/70 102/69  ?Pulse: 80 89 89 89  ?Resp: '18 18 18 16  ' ?Temp:  98.9 ?F (37.2 ?C) 98.4 ?F (36.9 ?C) 98.4 ?F (36.9 ?C)  ?TempSrc:      ?SpO2:  100% 100% 100%  ?Weight:      ?Height:      ? ?General: alert, cooperative, and no distress ?Lochia: appropriate ?Uterine Fundus: firm ?Incision: N/A ?DVT Evaluation: No evidence of DVT seen on physical exam. ?Labs: ?Lab Results  ?Component Value Date  ? WBC 5.9 09/19/2021  ? HGB 9.9 (L) 09/19/2021  ? HCT 32.1 (L) 09/19/2021  ? MCV 85.1 09/19/2021  ? PLT 145 (L) 09/19/2021  ? ? ?  Latest Ref Rng & Units 09/19/2021  ?  3:51 PM  ?CMP  ?Glucose 70 - 99 mg/dL 96    ?BUN 6 - 20 mg/dL 5    ?Creatinine 0.44 - 1.00 mg/dL 0.50    ?Sodium 135 - 145 mmol/L 139    ?Potassium 3.5 - 5.1 mmol/L 3.8    ?Chloride 98 - 111 mmol/L 112    ?CO2 22 - 32 mmol/L 19    ?Calcium 8.9 - 10.3 mg/dL 8.4    ?Total Protein 6.5 - 8.1 g/dL 6.4    ?Total Bilirubin 0.3 - 1.2 mg/dL 0.4    ?Alkaline Phos 38 - 126 U/L 237    ?AST 15 - 41 U/L 15    ?  ALT 0 - 44 U/L 10    ? ?Edinburgh Score: ? ?  09/20/2021  ? 11:26 PM  ?Flavia Shipper Postnatal Depression Scale Screening Tool  ?I have been able to laugh and see the funny side of things. 0  ?I have looked forward with enjoyment to things. 1  ?I have blamed myself unnecessarily when things went wrong. 1  ?I have been anxious or worried for no good reason. 0  ?I have felt scared or panicky for no good reason. 1  ?Things have been getting on top of me. 1  ?I have been so unhappy that I have had difficulty sleeping. 0  ?I have felt sad or miserable. 1  ?I have been so unhappy that I have been crying. 1  ?The thought of harming myself has occurred to me. 0  ?Edinburgh Postnatal Depression Scale Total 6  ? ? ? ?After visit meds:  ?Allergies as of 09/22/2021   ?No Known Allergies ?  ? ?  ?Medication  List  ?  ? ?STOP taking these medications   ? ?aspirin 81 MG chewable tablet ?  ?famotidine 20 MG tablet ?Commonly known as: PEPCID ?  ?ondansetron 8 MG disintegrating tablet ?Commonly known as: Zofran ODT ?  ? ?  ? ?TAKE these medications   ? ?acetaminophen 325 MG tablet ?Commonly known as: Tylenol ?Take 2 tablets (650 mg total) by mouth every 4 (four) hours as needed (for pain scale < 4). ?  ?Blood Pressure Kit Devi ?1 Device by Other route as needed. ?  ?furosemide 20 MG tablet ?Commonly known as: LASIX ?Take 1 tablet (20 mg total) by mouth daily. ?Start taking on: September 23, 2021 ?  ?Gojji Weight Scale Misc ?1 Device by Does not apply route as needed. ?  ?ibuprofen 600 MG tablet ?Commonly known as: ADVIL ?Take 1 tablet (600 mg total) by mouth every 6 (six) hours. ?  ?NIFEdipine 30 MG 24 hr tablet ?Commonly known as: ADALAT CC ?Take 1 tablet (30 mg total) by mouth daily. ?Start taking on: September 23, 2021 ?  ?PrePLUS 27-1 MG Tabs ?Take 1 tablet by mouth daily. ?  ? ?  ? ?Discharge home in stable condition ?Infant Feeding: Breast ?Infant Disposition:home with mother ?Discharge instruction: per After Visit Summary and Postpartum booklet. ?Activity: Advance as tolerated. Pelvic rest for 6 weeks.  ?Diet: routine diet ?Future Appointments: ?Future Appointments  ?Date Time Provider [place]  ?09/29/2021  2:30 PM [facility] NURSE [facility] [place]  ?10/30/2021  3:15 PM Starr Lake, CNM [facility]  ? ?Follow up Visit: ?Myrtis Ser, CNM  P [facility] Admin Pool ?Please schedule this patient for Postpartum visit in: 6 weeks with the following provider: Any provider  ?In-Person  ?For C/S patients schedule nurse incision check in weeks 2 weeks: no  ?High risk pregnancy complicated by: gHTN  ?Delivery mode:  SVD  ?Anticipated Birth Control:  PP IUD Placed  ?PP Procedures needed: IUD string trim @ PP visit; BP check w RN in 1wk  ?Schedule Integrated BH visit: no  ? ?09/22/2021 ?Gabriel Carina, CNM ? ? ? ?

## 2021-09-20 NOTE — Lactation Note (Signed)
This note was copied from a baby's chart. ?Lactation Consultation Note ?Mom trying to latch baby to Rt. Breast. Baby latches off and on frequently. ?Mom unable to latch on Lt. Breast. ?Mom had to use NS w/her daughter to latch but stated she really didn't latch then d/t/ tongue tie. ?LC fitted mom w/#20 NS. Baby latched well to Lt. Breast at first then started fighting it. ?LC removed baby ans swaddled. Baby appears tired. ?Mom shown how to use DEBP & how to disassemble, clean, & reassemble parts. Mom knows to pump q3h for 15-20 min. Mom encouraged to feed baby 8-12 times/24 hours and with feeding cues.   ?Shells given to wear in am. ?Mom pumping when LC left. ?Discussed w/mom possibility may need to supplement if baby starts loosing a lot of weight since has LBW. Mom stated OK. Encouraged to always use colostrum first before giving anything else. ?Encouraged to call for assistance as needed. ? ?Patient Name: Meagan Martin ?Today's Date: 09/20/2021 ?Reason for consult: Initial assessment ?Age:23 hours ? ?Maternal Data ?Has patient been taught Hand Expression?: Yes ?Does the patient have breastfeeding experience prior to this delivery?: Yes ?How long did the patient breastfeed?: her daughter had tongue tie.she had trouble latching ? ?Feeding ?  ? ?LATCH Score ?Latch: Repeated attempts needed to sustain latch, nipple held in mouth throughout feeding, stimulation needed to elicit sucking reflex. ? ?Audible Swallowing: None ? ?Type of Nipple: Everted at rest and after stimulation (very short shaft) ? ?Comfort (Breast/Nipple): Soft / non-tender ? ?Hold (Positioning): No assistance needed to correctly position infant at breast. ? ?LATCH Score: 7 ? ? ?Lactation Tools Discussed/Used ?Tools: Shells;Pump;Flanges;Nipple Dorris Carnes ?Nipple shield size: 20 ?Flange Size: 21 ?Breast pump type: Double-Electric Breast Pump ?Pump Education: Setup, frequency, and cleaning;Milk Storage ?Reason for Pumping: very short shaft  nipples ?Pumping frequency: Q3h ?Pumped volume: 0 mL ? ?Interventions ?  ? ?Discharge ?  ? ?Consult Status ?Consult Status: Follow-up ?Date: 09/20/21 ?Follow-up type: In-patient ? ? ? ?Charyl Dancer ?09/20/2021, 6:31 AM ? ? ? ?

## 2021-09-20 NOTE — Procedures (Signed)
POST-PLACENTAL IUD INSERTION PROCEDURE NOTE ?Patient name: Meagan Martin MRN [id number]  Date of birth: 07/13/2000 ? ?The risks and benefits of the method and placement have been thouroughly reviewed with the patient and all questions were answered.  Specifically the patient is aware of failure rate of 06/998, expulsion of the IUD and of possible perforation.  The patient is aware of irregular bleeding due to the method and understands the incidence of irregular bleeding diminishes with time.   ?Signed copy of informed consent in chart.  ? ?Vaginal, labial and perineal areas thoroughly inspected for lacerations. Lacerations: none laceration identified and if needed was repaired prior to IUD insertion ? ?Pt's IUD choice: Liletta ? ?Time out was performed.   ? ?IUD removed from insertion device and grasped between sterile gloved fingers. Fundus identified through abdominal wall using non-insertion hand. IUD inserted to fundus with bimanual technique. IUD carefully released at the fundus and insertion hand gently removed from vagina.  ?  ?Strings trimmed to the level of the introitus. Patient tolerated procedure well. ? ?Patient given post procedure instructions and IUD care card with expiration date.  Patient is asked to keep IUD strings tucked in her vagina until her postpartum follow up visit in 4-6 weeks. Patient advised to abstain from sexual intercourse and pulling on strings before her follow-up visit. Patient verbalized an understanding of the plan of care and agrees.  ? ?Pt added to the post-placental IUD insertion list and charges entered. ? ?LOT: [id number] ?EXP: 05/2024 ? ?[name] CNM ?09/20/2021 ?2:09 AM  ?

## 2021-09-20 NOTE — Social Work (Signed)
CSW received consult for hx of Anxiety.  CSW met with MOB to offer support and complete assessment.   ? ?CSW met with MOB at bedside and introduced CSW role. CSW observed MOB sitting up and FOB at bedside holding the infant. MOB presented calm and welcomed CSW visit with FOB present. CSW inquired how MOB has felt since giving birth. MOB reported "feeling good, better than last time." MOB shared that she had a great delivery experience, " the nurses and everyone was very nice." CSW inquired how MOB felt emotionally during the pregnancy. MOB reported that she felt physically tired and emotionally good. MOB reported she has experienced anxiety in the past but has not been officially diagnosed with anxiety. MOB reported that she is currently adjusting to life after graduation and starting a new job has increase her anxiety. MOB reported no medication use or therapy. MOB reported she uses her coping strategies such as spending time with family, engaging in self-care like getting a manicure and pedicure CSW praised MOB for her efforts. CSW discussed PPD. MOB reported no history of PPD/anxiety and was receptive to the resources provided. CSW provided education regarding the baby blues period vs. perinatal mood disorders, discussed treatment and gave resources for mental health follow up if concerns arise.  CSW recommended MOB complete the self-evaluation during the postpartum time period using the New Mom Checklist from Postpartum Progress and encouraged MOB to contact a medical professional if symptoms are noted at any time. MOB reported she feels comfortable reaching out to her doctor if she has concerns. CSW assessed MOB for safety. MOB denied thoughts of harm to self and others. MOB identified FOB, her sister, mom, and her in-laws as supports. ? ?MOB reported that she has essential items for the infant including a crib where the infant will sleep. CSW provided review of Sudden Infant Death Syndrome (SIDS) precautions.  MOB has chosen Agricultural consultant for the infant's follow up care. CSW assessed MOB for additional needs. MOB reported no further need.  ? ?CSW identifies no further need for intervention and no barriers to discharge at this time.  ? ?Kathrin Greathouse, MSW, LCSW ?[facility] [place]  ?Clinical Social Worker  ?[phone] ?09/20/2021  3:28 PM  ?

## 2021-09-20 NOTE — Progress Notes (Signed)
Call placed to MD to report B/P  now 138/99, with mild H/A pain scale 3/10. Denies blurry vision. Tylenol given , call bell in reach and family at bedside. MD in delivery message left to return call ?

## 2021-09-21 ENCOUNTER — Encounter (HOSPITAL_COMMUNITY): Payer: Self-pay | Admitting: Obstetrics and Gynecology

## 2021-09-21 LAB — RH IG WORKUP (INCLUDES ABO/RH)
Fetal Screen: NEGATIVE
Gestational Age(Wks): 38
Unit division: 0

## 2021-09-21 MED ORDER — NIFEDIPINE ER OSMOTIC RELEASE 30 MG PO TB24
30.0000 mg | ORAL_TABLET | Freq: Every day | ORAL | Status: DC
  Administered 2021-09-21 – 2021-09-22 (×2): 30 mg via ORAL
  Filled 2021-09-21 (×2): qty 1

## 2021-09-21 NOTE — Lactation Note (Signed)
This note was copied from a baby's chart. ?Lactation Consultation Note ? ?Patient Name: Meagan Martin ?Today's Date: 09/21/2021 ?Reason for consult: Difficult latch;Mother's request;Follow-up assessment;Early term 35-38.6wks (Mom has been using 20 mm NS, infant was supplemented at the breast with donor breast milk using a curve tip syringe as mom continues to work on latching infant at the breast.) ?Age:23 hours, P2, ETI female infant with-6% weight loss. ?Per mom, she has not been using DEBP, LC discussed the importance of pumping to help stimulate and establish mom's milk supply and due to mom using 20 mm NS. ?Mom latched infant on her right breast using 20 mm NS, infant sustained latch and supplemented with donor breast milk at the breast using curve tip syringe, infant sustained latch and BF for 20 minutes on mom's right breast and 10 minutes on mom's left breast. ?Infant latched with depth and actively BF total of 30 minutes, infant given 15 mls of donor breast milk at the breast. ?Mom plans to use DEBP soon. ?Mom's current feeding plan: ?Mom will continue to BF infant according to feeding cues, 8 to 12+ or more times within 24 hours, skin to skin ?2.Mom will continue to use 20 mm NS tonight but will attempt to latch infant in the morning without NS. ?3.Mom will continue to use DEBP every 3 hours for 15 minutes on initial setting, mom will offer any EBM first before donor breast milk. ?4.Mom knows to continue to ask RN/LC for latch assistance if needed or help with applying 20 mm NS.  ? ?Maternal Data ?  ? ?Feeding ?Mother's Current Feeding Choice: Breast Milk and Donor Milk ? ?LATCH Score ?Latch: Grasps breast easily, tongue down, lips flanged, rhythmical sucking. ? ?Audible Swallowing: Spontaneous and intermittent ? ?Type of Nipple: Everted at rest and after stimulation (short shafted) ? ?Comfort (Breast/Nipple): Filling, red/small blisters or bruises, mild/mod discomfort ? ?Hold (Positioning):  Assistance needed to correctly position infant at breast and maintain latch. ? ?LATCH Score: 8 ? ? ?Lactation Tools Discussed/Used ?Tools: Pump ?Nipple shield size: 20 ?Breast pump type: Double-Electric Breast Pump ?Pump Education: Setup, frequency, and cleaning;Milk Storage ?Reason for Pumping: ETI infant, previously not latching well at the breast, -6% weight loss and using 20 mm NS ?Pumping frequency: Mom will start pumping every 3 hours for 15 minutes on inital setting. ? ?Interventions ?Interventions: Assisted with latch;Skin to skin;Breast compression;Adjust position;Support pillows;Position options;DEBP;Education;Pace feeding;Pre-pump if needed ? ?Discharge ?  ? ?Consult Status ?Consult Status: Follow-up ?Date: 09/22/21 ?Follow-up type: In-patient ? ? ? ?Danelle Earthly ?09/21/2021, 11:51 PM ? ? ? ?

## 2021-09-21 NOTE — Progress Notes (Addendum)
Post Partum Day 1 ?Subjective: ?No complaints, up ad lib, voiding, tolerating PO, and + flatus. Baby is stable. ? ?Objective: ?Blood pressure (!) 129/93, pulse 89, temperature 97.6 ?F (36.4 ?C), resp. rate 18, height 5\' 1"  (1.549 m), weight 71.8 kg, last menstrual period 12/27/2020, SpO2 99 %, unknown if currently breastfeeding. ?Patient Vitals for the past 24 hrs: ? BP Temp Temp src Pulse Resp SpO2  ?09/21/21 1043 (!) 129/93 97.6 ?F (36.4 ?C) -- 89 18 99 %  ?09/21/21 0500 120/84 98.1 ?F (36.7 ?C) Oral 68 18 100 %  ?09/20/21 2024 126/83 98.1 ?F (36.7 ?C) Oral 78 18 100 %  ?09/20/21 1712 (!) 138/99 98.1 ?F (36.7 ?C) Oral 69 18 100 %  ?09/20/21 1450 (!) 125/96 -- -- 85 -- --  ?09/20/21 1315 (!) 130/91 -- -- 70 18 --  ? ? ?Physical Exam:  ?General: alert and no distress ?Lochia: appropriate ?Uterine Fundus: firm ?DVT Evaluation: No evidence of DVT seen on physical exam.Negative Homan's sign. ?No cords or calf tenderness. No significant calf/ankle edema. ? ?Recent Labs  ?  09/19/21 ?1615  ?HGB 9.9*  ?HCT 32.1*  ? ?Assessment/Plan: ?Started Procardia XL 30 daily for BP control, monitor BP closely ?Continue routine postpartum care ?Breastfeeding ?Plan for discharge tomorrow if stable. ? ? ? LOS: 2 days  ? ?Verita Schneiders, MD ?09/21/2021, 10:48 AM  ? ? ?

## 2021-09-22 ENCOUNTER — Other Ambulatory Visit (HOSPITAL_COMMUNITY): Payer: Self-pay

## 2021-09-22 DIAGNOSIS — Z975 Presence of (intrauterine) contraceptive device: Secondary | ICD-10-CM

## 2021-09-22 MED ORDER — IBUPROFEN 600 MG PO TABS
600.0000 mg | ORAL_TABLET | Freq: Four times a day (QID) | ORAL | 0 refills | Status: AC
  Filled 2021-09-22: qty 30, 8d supply, fill #0

## 2021-09-22 MED ORDER — FUROSEMIDE 20 MG PO TABS
20.0000 mg | ORAL_TABLET | Freq: Every day | ORAL | 0 refills | Status: DC
  Filled 2021-09-22: qty 5, 5d supply, fill #0

## 2021-09-22 MED ORDER — NIFEDIPINE ER 30 MG PO TB24
30.0000 mg | ORAL_TABLET | Freq: Every day | ORAL | 0 refills | Status: DC
  Filled 2021-09-22: qty 30, 30d supply, fill #0

## 2021-09-22 MED ORDER — ACETAMINOPHEN 325 MG PO TABS
650.0000 mg | ORAL_TABLET | ORAL | 1 refills | Status: AC | PRN
  Filled 2021-09-22: qty 60, 5d supply, fill #0

## 2021-09-22 NOTE — Lactation Note (Addendum)
This note was copied from a baby's chart. ?Lactation Consultation Note ? ?Patient Name: Meagan Martin ?Today's Date: 09/22/2021 ?Reason for consult: Follow-up assessment ?Age:21 years ? ?I assisted with & taught parents how to supplement with a 5Fr tubing & syringe behind the nipple shield. Infant fed until content (taking a total of 32 mL). Mom preferred this new way of supplementing compared to what they had been doing.  ? ?Infant was changed from a size 20 to a size 24 nipple shield b/c infant was acting as if he wanted a larger surface area in his mouth & b/c the size 24 will draw a larger amount of breast tissue into Mom's mouth.  ? ?Plan: ?Offer breast with size 24 NS. Supplement at breast with EBM/formula (Mom is willing to use formula once at home).  ?Pump and save EBM to give at breast at next feeding.  ? ?Dad was shown how to wash the tubing and syringe. Mom knows to wash NS after every use and to sanitize once/day.  ? ?An update was given to H. Ann Maki, NP. ? ?Lactation Tools Discussed/Used ?Nipple shield size: 24;20 ?Flange Size: 21 ?Pumped volume: 7 mL ? ?  ?Discharge ?Discharge Education: Outpatient recommendation;Outpatient Epic message sent ?Pump: Personal (Mom has a Medela pump and a wearable pump at home) ? ? ?Meagan Martin ?09/22/2021, 12:00 PM ? ? ? ?

## 2021-09-26 ENCOUNTER — Encounter: Payer: Medicaid Other | Admitting: Obstetrics and Gynecology

## 2021-09-29 ENCOUNTER — Ambulatory Visit (INDEPENDENT_AMBULATORY_CARE_PROVIDER_SITE_OTHER): Payer: Medicaid Other

## 2021-09-29 VITALS — BP 106/63 | HR 106 | Ht 61.0 in | Wt 138.2 lb

## 2021-09-29 DIAGNOSIS — Z0130 Encounter for examination of blood pressure without abnormal findings: Secondary | ICD-10-CM

## 2021-09-29 NOTE — Progress Notes (Signed)
Pt here today for BP check.  Pt reports that she is taking Procardia 30 mg daily last dose at 1330.  Pt denies headache and visual disturbances.  BP LA 106/63.  Pt advised to continue taking BP medication as prescribed, monitor for sx's of elevated BP, and to call the office with questions/concerns.  Pt advised that she will be evaluated at her pp visit scheduled on 10/30/21.  Pt verbalized understanding with no further questions.  ? ?Kamile Fassler,RN  ?09/29/21 ?

## 2021-10-01 ENCOUNTER — Telehealth (HOSPITAL_COMMUNITY): Payer: Self-pay | Admitting: *Deleted

## 2021-10-01 NOTE — Telephone Encounter (Signed)
Opened chart for Hospital Discharge Follow Up call.  Patient was seen in office on 09-29-21.  Did not place call. ?

## 2021-10-03 ENCOUNTER — Encounter: Payer: Self-pay | Admitting: Radiology

## 2021-10-30 ENCOUNTER — Ambulatory Visit (INDEPENDENT_AMBULATORY_CARE_PROVIDER_SITE_OTHER): Payer: Medicaid Other | Admitting: Student

## 2021-10-30 ENCOUNTER — Encounter: Payer: Self-pay | Admitting: Student

## 2021-10-30 DIAGNOSIS — Z3400 Encounter for supervision of normal first pregnancy, unspecified trimester: Secondary | ICD-10-CM

## 2021-10-30 NOTE — Progress Notes (Deleted)
? ? ?  Post Partum Visit Note ? ?Meagan Martin is a 21 y.o. G67P2002 female who presents for a postpartum visit. She is  5.5  weeks postpartum following a normal spontaneous vaginal delivery.  I have fully reviewed the prenatal and intrapartum course. The delivery was at 38.1 gestational weeks.  Anesthesia: epidural. Postpartum course has been ***. Baby is doing well***. Baby is feeding by {breastmilk/bottle:69}. Bleeding {vag bleed:12292}. Bowel function is {normal:32111}. Bladder function is {normal:32111}. Patient {is/is not:9024} sexually active. Contraception method is IUD. Postpartum depression screening: {gen negative/positive:315881}. ? ? ?The pregnancy intention screening data noted above was reviewed. Potential methods of contraception were discussed. The patient elected to proceed with No data recorded. ? ? ? ?Health Maintenance Due  ?Topic Date Due  ? COVID-19 Vaccine (3 - Booster for Pfizer series) 11/28/2019  ? ? ?{Common ambulatory SmartLinks:19316} ? ?Review of Systems ?{ros; complete:30496} ? ?Objective:  ?There were no vitals taken for this visit.  ? ?General:  {gen appearance:16600}  ? Breasts:  {desc; normal/abnormal/not indicated:14647}  ?Lungs: {lung exam:16931}  ?Heart:  {heart exam:5510}  ?Abdomen: {abdomen exam:16834}   ?Wound {Wound assessment:11097}  ?GU exam:  {desc; normal/abnormal/not indicated:14647}  ?     ?Assessment:  ? ? 1. Supervision of normal first pregnancy, antepartum ?*** ? ? ?*** postpartum exam.  ? ?Plan:  ? ?Essential components of care per ACOG recommendations: ? ?1.  Mood and well being: Patient with {gen negative/positive:315881} depression screening today. Reviewed local resources for support.  ?- Patient tobacco use? {tobacco use:25506}  ?- hx of drug use? {yes/no:25505}   ? ?2. Infant care and feeding:  ?-Patient currently breastmilk feeding? {yes/no:25502}  ?-Social determinants of health (SDOH) reviewed in EPIC. No concerns***The following needs were  identified*** ? ?3. Sexuality, contraception and birth spacing ?- Patient {DOES_DOES YQI:34742} want a pregnancy in the next year.  Desired family size is {NUMBER 1-10:22536} children.  ?- Reviewed reproductive life planning. Reviewed contraceptive methods based on pt preferences and effectiveness.  Patient desired {Upstream End Methods:24109} today.   ?- Discussed birth spacing of 18 months ? ?4. Sleep and fatigue ?-Encouraged family/partner/community support of 4 hrs of uninterrupted sleep to help with mood and fatigue ? ?5. Physical Recovery  ?- Discussed patients delivery and complications. She describes her labor as {description:25511} ?- Patient had a {CHL AMB DELIVERY:[phone]}. Patient had a {laceration:25518} laceration. Perineal healing reviewed. Patient expressed understanding ?- Patient has urinary incontinence? {yes/no:25515} ?- Patient {ACTION; IS/IS VZD:[id number]} safe to resume physical and sexual activity ? ?6.  Health Maintenance ?- HM due items addressed {Yes or If no, why not?:20788} ?- Last pap smear  ?Diagnosis  ?Date Value Ref Range Status  ?03/10/2021   Final  ? - Negative for intraepithelial lesion or malignancy (NILM)  ? Pap smear {done:10129} at today's visit.  ?-Breast Cancer screening indicated? {indicated:25516} ? ?7. Chronic Disease/Pregnancy Condition follow up: {Follow up:25499} ? ?- PCP follow up ? ?Guy Begin, CMA ?Center for [employer], [facility]  ?

## 2021-10-30 NOTE — Progress Notes (Signed)
Pt states when she walks it feels like something is sticking her inside her Vagina. ?

## 2021-10-30 NOTE — Progress Notes (Signed)
? ? ?Post Partum Visit Note ? ?Meagan Martin is a 21 y.o. G67P2002 female who presents for a postpartum visit. She is 5 weeks postpartum following a normal spontaneous vaginal delivery.  I have fully reviewed the prenatal and intrapartum course. The delivery was at 38/1 gestational weeks.  Anesthesia: epidural. Postpartum course has been good. Baby is doing well. Baby is feeding by both breast and bottle - Carn[employer] Good Start. Bleeding moderate lochia. Bowel function is normal. Bladder function is normal. Patient is not sexually active. Contraception method is IUD. Postpartum depression screening: negative. ? ? ?The pregnancy intention screening data noted above was reviewed. Potential methods of contraception were discussed. The patient elected to proceed with IUD that was placed post placental. ? ? Edinburgh Postnatal Depression Scale - 10/30/21 1536   ? ?  ? Edinburgh Postnatal Depression Scale:  In the Past 7 Days  ? I have been able to laugh and see the funny side of things. 0   ? I have looked forward with enjoyment to things. 0   ? I have blamed myself unnecessarily when things went wrong. 0   ? I have been anxious or worried for no good reason. 0   ? I have felt scared or panicky for no good reason. 0   ? Things have been getting on top of me. 0   ? I have been so unhappy that I have had difficulty sleeping. 0   ? I have felt sad or miserable. 0   ? I have been so unhappy that I have been crying. 0   ? The thought of harming myself has occurred to me. 0   ? Edinburgh Postnatal Depression Scale Total 0   ? ?  ?  ? ?  ? ? ?Health Maintenance Due  ?Topic Date Due  ? COVID-19 Vaccine (3 - Booster for Pfizer series) 11/28/2019  ? ? ?The following portions of the patient's history were reviewed and updated as appropriate: allergies, current medications, past family history, past medical history, past social history, past surgical history, and problem list. ? ?Review of Systems ?Pertinent items are noted  in HPI. ? ?Objective:  ?BP 113/75   Pulse 77   Ht 5\' 1"  (1.549 m)   Wt 135 lb 3.2 oz (61.3 kg)   Breastfeeding Yes Comment: And Bottle  BMI 25.55 kg/m?   ? ?General:  alert and cooperative  ? Breasts:  normal  ?Lungs: clear to auscultation bilaterally  ?Heart:  regular rate and rhythm, S1, S2 normal, no murmur, click, rub or gallop  ?Abdomen: soft, non-tender; bowel sounds normal; no masses,  no organomegaly   ?Wound None  ?GU exam:  normal  ?     ?Assessment:  ? ? 1. Supervision of normal first pregnancy, antepartum ?-IUD strings trimmed; patient doing well otherwise.  ?Healthy  postpartum exam.  ? ?Plan:  ? ?Essential components of care per ACOG recommendations: ? ?1.  Mood and well being: Patient with negative depression screening today. Reviewed local resources for support.  ?- Patient tobacco use? No.   ?- hx of drug use? No.   ? ?2. Infant care and feeding:  ?-Patient currently breastmilk feeding? No.  ?-Social determinants of health (SDOH) reviewed in EPIC. No concerns  ? ?3. Sexuality, contraception and birth spacing ?- Patient does not want a pregnancy in the next year.  Desired family size is 3 children.  ?- Reviewed reproductive life planning. Reviewed contraceptive methods based on pt preferences and effectiveness.  Patient desired IUD or IUS today.   ?- Discussed birth spacing of 18 months ? ?4. Sleep and fatigue ?-Encouraged family/partner/community support of 4 hrs of uninterrupted sleep to help with mood and fatigue ? ?5. Physical Recovery  ?- Discussed patients delivery and complications. She describes her labor as good. ?- Patient had a Vaginal, no problems at delivery. Patient had a  NA  laceration. Perineal healing reviewed. Patient expressed understanding ?- Patient has urinary incontinence? No. ?- Patient is not safe to resume physical and sexual activity ? ?6.  Health Maintenance ?- HM due items addressed No - BP has been normal since delivery, although she had gestational hypertension at  the end of pregnancy.  ?- Last pap smear  ?Diagnosis  ?Date Value Ref Range Status  ?03/10/2021   Final  ? - Negative for intraepithelial lesion or malignancy (NILM)  ? Pap smear not done at today's visit.  ?-Breast Cancer screening indicated? No.  ? ?7. Chronic Disease/Pregnancy Condition follow up: None ? ?- PCP follow up ? ?Marylene Land, CNM ?Center for [employer], [facility]  ?

## 2021-12-01 ENCOUNTER — Encounter: Payer: Self-pay | Admitting: Student

## 2021-12-01 DIAGNOSIS — F32A Depression, unspecified: Secondary | ICD-10-CM

## 2021-12-08 NOTE — BH Specialist Note (Unsigned)
Integrated Behavioral Health via Telemedicine Visit  12/08/2021 Meagan Martin [id number]  Number of Integrated Behavioral Health Clinician visits: No data recorded Session Start time: No data recorded  Session End time: No data recorded Total time in minutes: No data recorded  Referring Provider: *** Patient/Family location: *** [facility] Provider location: *** All persons participating in visit: *** Types of Service: {CHL AMB TYPE OF [phone]}  I connected with Meagan Martin and/or Meagan Martin's {family members:20773} via  Telephone or Video Enabled Telemedicine Application  (Video is Caregility application) and verified that I am speaking with the correct person using two identifiers. Discussed confidentiality: {YES/NO:21197}  I discussed the limitations of telemedicine and the availability of in person appointments.  Discussed there is a possibility of technology failure and discussed alternative modes of communication if that failure occurs.  I discussed that engaging in this telemedicine visit, they consent to the provision of behavioral healthcare and the services will be billed under their insurance.  Patient and/or legal guardian expressed understanding and consented to Telemedicine visit: {YES/NO:21197}  Presenting Concerns: Patient and/or family reports the following symptoms/concerns: *** Duration of problem: ***; Severity of problem: {Mild/Moderate/Severe:20260}  Patient and/or Family's Strengths/Protective Factors: {CHL AMB BH PROTECTIVE FACTORS:[phone]}  Goals Addressed: Patient will:  Reduce symptoms of: {IBH Symptoms:21014056}   Increase knowledge and/or ability of: {IBH Patient Tools:21014057}   Demonstrate ability to: {IBH Goals:21014053}  Progress towards Goals: {CHL AMB BH PROGRESS TOWARDS [phone]}  Interventions: Interventions utilized:  {IBH Interventions:21014054} Standardized Assessments completed: {IBH  Screening Tools:21014051}  Patient and/or Family Response: ***  Assessment: Patient currently experiencing ***.   Patient may benefit from ***.  Plan: Follow up with behavioral health clinician on : *** Behavioral recommendations: *** Referral(s): {IBH Referrals:21014055}  I discussed the assessment and treatment plan with the patient and/or parent/guardian. They were provided an opportunity to ask questions and all were answered. They agreed with the plan and demonstrated an understanding of the instructions.   They were advised to call back or seek an in-person evaluation if the symptoms worsen or if the condition fails to improve as anticipated.  Valetta Close Genesis Paget, LCSW

## 2021-12-22 ENCOUNTER — Ambulatory Visit: Payer: Medicaid Other | Admitting: Clinical

## 2021-12-22 DIAGNOSIS — Z91199 Patient's noncompliance with other medical treatment and regimen due to unspecified reason: Secondary | ICD-10-CM

## 2022-01-08 NOTE — BH Specialist Note (Signed)
Integrated Behavioral Health via Telemedicine Visit  01/16/2022 Meagan Martin [id number]  Number of Integrated Behavioral Health Clinician visits: 1- Initial Visit  Session Start time: 1028   Session End time: 1111  Total time in minutes: 43   Referring Provider: Luna Kitchens, CNM Patient/Family location: Work [facility] Provider location: Center for [employer] at [facility]  All persons participating in visit: Patient Meagan Martin and [facility] Saraiya Kozma   Types of Service: Individual psychotherapy and Video visit  I connected with Meagan Martin and/or Meagan Martin's  n/a  via  Telephone or Video Enabled Telemedicine Application  (Video is Caregility application) and verified that I am speaking with the correct person using two identifiers. Discussed confidentiality: Yes   I discussed the limitations of telemedicine and the availability of in person appointments.  Discussed there is a possibility of technology failure and discussed alternative modes of communication if that failure occurs.  I discussed that engaging in this telemedicine visit, they consent to the provision of behavioral healthcare and the services will be billed under their insurance.  Patient and/or legal guardian expressed understanding and consented to Telemedicine visit: Yes   Presenting Concerns: Patient and/or family reports the following symptoms/concerns: Anxiety increase postpartum; feeling "really nervous and fidgety" impacting work; open to implementing self-coping strategy. Duration of problem: Increase postpartum; Severity of problem: moderate  Patient and/or Family's Strengths/Protective Factors: Social connections, Concrete supports in place (healthy food, safe environments, etc.), and Sense of purpose  Goals Addressed: Patient will:  Reduce symptoms of: anxiety   Increase knowledge and/or ability of: healthy habits and  self-management skills   Demonstrate ability to: Increase healthy adjustment to current life circumstances and Increase adequate support systems for patient/family  Progress towards Goals: Ongoing  Interventions: Interventions utilized:  Mindfulness or Management consultant and Psychoeducation and/or Health Education Standardized Assessments completed: GAD-7 and PHQ 9  Patient and/or Family Response: Patient agrees with treatment plan.   Assessment: Patient currently experiencing Adjustment disorder with anxious mood.   Patient may benefit from psychoeducation and brief therapeutic interventions regarding coping with symptoms of anxiety .  Plan: Follow up with behavioral health clinician on : Two weeks Behavioral recommendations:  -CALM relaxation breathing exercise twice daily (morning; at bedtime); as needed throughout the day. -Read educational materials regarding coping with symptoms of anxiety with panic  Referral(s): Integrated [employer] (In Clinic)  I discussed the assessment and treatment plan with the patient and/or parent/guardian. They were provided an opportunity to ask questions and all were answered. They agreed with the plan and demonstrated an understanding of the instructions.   They were advised to call back or seek an in-person evaluation if the symptoms worsen or if the condition fails to improve as anticipated.  Rae Lips, LCSW     01/16/2022   10:43 AM 09/18/2021    8:34 AM 09/10/2021    2:46 PM 07/21/2021    9:57 AM 07/07/2021   11:11 AM  Depression screen PHQ 2/9  Decreased Interest 0 0 0 0 2  Down, Depressed, Hopeless 1 0 0 0 1  PHQ - 2 Score 1 0 0 0 3  Altered sleeping 0 0 0 1 1  Tired, decreased energy 2 1 0 1 1  Change in appetite 0 1 0 0 0  Feeling bad or failure about yourself  1 0 0 0 0  Trouble concentrating 0 0 0 0 0  Moving slowly or fidgety/restless 1 0 0 0 0  Suicidal thoughts 0 0 0 0 0  PHQ-9 Score 5 2 0 2 5       01/16/2022   10:46 AM 09/18/2021    8:34 AM 09/10/2021    2:45 PM 07/21/2021    9:57 AM  GAD 7 : Generalized Anxiety Score  Nervous, Anxious, on Edge 3 0 0 0  Control/stop worrying 1 0 0 0  Worry too much - different things 2 1 1 1   Trouble relaxing 1 0 0 1  Restless 0 0 0 0  Easily annoyed or irritable 2 1 0 0  Afraid - awful might happen 0 0 0 0  Total GAD 7 Score 9 2 1  2

## 2022-01-16 ENCOUNTER — Ambulatory Visit (INDEPENDENT_AMBULATORY_CARE_PROVIDER_SITE_OTHER): Payer: Medicaid Other | Admitting: Clinical

## 2022-01-16 DIAGNOSIS — F419 Anxiety disorder, unspecified: Secondary | ICD-10-CM | POA: Diagnosis not present

## 2022-01-16 NOTE — Patient Instructions (Addendum)
Center for [facility] at [facility] for Women [address] [place], [place] [address] [phone] (main office) [phone] (Lora Glomski's office)  [email or web address] (new mom support groups)  Coping with Panic Attacks   What is a panic attack?  You may have had a panic attack if you experienced four or more of the symptoms listed below coming on abruptly and peaking in about 10 minutes.  Panic Symptoms    Pounding heart   Sweating   Trembling or shaking   Shortness of breath   Feeling of choking   Chest pain   Nausea or abdominal distress     Feeling dizzy, unsteady, lightheaded, or faint   Feelings of unreality or being detached from yourself   Fear of losing control or going crazy   Fear of dying   Numbness or tingling   Chills or hot flashes      Panic attacks are sometimes accompanied by avoidance of certain places or situations. These are often situations that would be difficult to escape from or in which help might not be available. Examples might include crowded shopping malls, public transportation, restaurants, or driving.   Why do panic attacks occur?   Panic attacks are the body's alarm system gone awry. All of [place] have a built-in alarm system, powered by adrenaline, which increases our heart rate, breathing, and blood flow in response to danger. Ordinarily, this 'danger response system' works well. In some people, however, the response is either out of proportion to whatever stress is going on, or may come out of the blue without any stress at all.   For example, if you are walking in the woods and see a bear coming your way, a variety of changes occur in your body to prepare you to either fight the danger or flee from the situation. Your heart rate will increase to get more blood flow around your body, your breathing rate will quicken so that more oxygen is available, and your muscles will tighten in order to be ready to fight or run. You may feel  nauseated as blood flow leaves your stomach area and moves into your limbs. These bodily changes are all essential to helping you survive the dangerous situation. After the danger has passed, your body functions will begin to go back to normal. This is because your body also has a system for "recovering" by bringing your body back down to a normal state when the danger is over.   As you can see, the emergency response system is adaptive when there is, in fact, a "true" or "real" danger (e.g., bear). However, sometimes people find that their emergency response system is triggered in "everyday" situations where there really is no true physical danger (e.g., in a meeting, in the grocery store, while driving in normal traffic, etc.).   What triggers a panic attack?  Sometimes particularly stressful situations can trigger a panic attack. For example, an argument with your spouse or stressors at work can cause a stress response (activating the emergency response system) because you perceive it as threatening or overwhelming, even if there is no direct risk to your survival.  Sometimes panic attacks don't seem to be triggered by anything in particular- they may "come out of the blue". Somehow, the natural "fight or flight" emergency response system has gotten activated when there is no real danger. Why does the body go into "emergency mode" when there is no real danger?   Often, people with panic attacks are frightened or  alarmed by the physical sensations of the emergency response system. First, unexpected physical sensations are experienced (tightness in your chest or some shortness of breath). This then leads to feeling fearful or alarmed by these symptoms ("Something's wrong!", "Am I having a heart attack?", "Am I going to faint?") The mind perceives that there is a danger even though no real danger exists. This, in turn, activates the emergency response system ("fight or flight"), leading to a "full blown"  panic attack. In summary, panic attacks occur when we misinterpret physical symptoms as signs of impending death, craziness, loss of control, embarrassment, or fear of fear. Sometimes you may be aware of thoughts of danger that activate the emergency response system (for example, thinking "I'm having a heart attack" when you feel chest pressure or increased heart rate). At other times, however, you may not be aware of such thoughts. After several incidences of being afraid of physical sensations, anxiety and panic can occur in response to the initial sensations without conscious thoughts of danger. Instead, you just feel afraid or alarmed. In other words, the panic or fear may seem to occur "automatically" without you consciously telling yourself anything.   After having had one or more panic attacks, you may also become more focused on what is going on inside your body. You may scan your body and be more vigilant about noticing any symptoms that might signal the start of a panic attack. This makes it easier for panic attacks to happen again because you pick up on sensations you might otherwise not have noticed, and misinterpret them as something dangerous. A panic attack may then result.      How do I cope with panic attacks?  An important part of overcoming panic attacks involves re-interpreting your body's physical reactions and teaching yourself ways to decrease the physical arousal. This can be done through practicing the cognitive and behavioral interventions below.   Research has found that over half of people who have panic attacks show some signs of hyperventilation or overbreathing. This can produce initial sensations that alarm you and lead to a panic attack. Overbreathing can also develop as part of the panic attack and make the symptoms worse. When people hyperventilate, certain blood vessels in the body become narrower. In particular, the brain may get slightly less oxygen. This can lead to  the symptoms of dizziness, confusion, and lightheadedness that often occur during panic attacks. Other parts of the body may also get a bit less oxygen, which may lead to numbness or tingling in the hands or feet or the sensation of cold, clammy hands. It also may lead the heart to pump harder. Although these symptoms may be frightening and feel unpleasant, it is important to remember that hyperventilating is not dangerous. However, you can help overcome the unpleasantness of overbreathing by practicing Breathing Retraining.   Practice this basic technique three times a day, every day:   Inhale. With your shoulders relaxed, inhale as slowly and deeply as you can while you count to six. If you can, use your diaphragm to fill your lungs with air.   Hold. Keep the air in your lungs as you slowly count to four.   Exhale. Slowly breath out as you count to six.   Repeat. Do the inhale-hold-exhale cycle several times. Each time you do it, exhale for longer counts.  Like any new skill, Breathing Retraining requires practice. Try practicing this skill twice a day for several minutes. Initially, do not try this technique  in specific situations or when you become frightened or have a panic attack. Begin by practicing in a quiet environment to build up your skill level so that you can later use it in time of "emergency."   2. Decreasing Avoidance  Regardless of whether you can identify why you began having panic attacks or whether they seemed to come out of the blue, the places where you began having panic attacks often can become triggers themselves. It is not uncommon for individuals to begin to avoid the places where they have had panic attacks. Over time, the individual may begin to avoid more and more places, thereby decreasing their activities and often negatively impacting their quality of life. To break the cycle of avoidance, it is important to first identify the places or situations that are being avoided,  and then to do some "relearning."  To begin this intervention, first create a list of locations or situations that you tend to avoid. Then choose an avoided location or situation that you would like to target first. Now develop an "exposure hierarchy" for this situation or location. An "exposure hierarchy" is a list of actions that make you feel anxious in this situation. Order these actions from least to most anxiety-producing. It is often helpful to have the first item on your hierarchy involve thinking or imagining part of the feared/avoided situation.   Here is an example of an exposure hierarchy for decreasing avoidance of the grocery store. Note how it is ordered from the least amount of anxiety (at the top) to the most anxiety (at the bottom):   Think about going to the grocery store alone.   Go to the grocery store with a friend or family member.   Go to the grocery store alone to pick up a few small items (5-10 minutes in the store).   Shopping for 10-20 minutes in the store alone.   Doing the shopping for the week by myself (20-30 minutes in the store).   Your homework is to "expose" yourself to the lowest item on your hierarchy and use your breathing relaxation and coping statements (see below) to help you remain in the situation. Practice this several times during the upcoming week. Once you have mastered each item with minimal anxiety, move on to the next higher action on your list.   Cognitive Interventions  Identify your negative self-talk Anxious thoughts can increase anxiety symptoms and panic. The first step in changing anxious thinking is to identify your own negative, alarming self-talk. Some common alarming thoughts:  I'm having a heart attack.            I must be going crazy. I think I'm dying. People will think I'm crazy. I'm going to pass our.  Oh no- here it comes.  I can't stand this.  I've got to get out of here!  2. Use positive coping statements Changing or  disrupting a pattern of anxious thoughts by replacing them with more calming or supportive statements can help to divert a panic attack. Some common helpful coping statements:  This is not an emergency.  I don't like feeling this way, but I can accept it.  I can feel like this and still be okay.  This has happened before, and I was okay. I'll be okay this time, too.  I can be anxious and still deal with this situation.

## 2022-01-26 NOTE — BH Specialist Note (Signed)
Integrated Behavioral Health via Telemedicine Visit  02/09/2022 Meagan Martin [id number]  Number of Integrated Behavioral Health Clinician visits: 2- Second Visit  Session Start time: 1055   Session End time: 1124  Total time in minutes: 29   Referring Provider: Luna Kitchens, CNM Patient/Family location: Home [facility] Provider location: [facility] at [facility]  All persons participating in visit: Patient Meagan Martin and [facility] Meagan Martin   Types of Service: Individual psychotherapy and Video visit  I connected with Meagan Martin and/or Meagan Martin's  n/a  via  Telephone or Video Enabled Telemedicine Application  (Video is Caregility application) and verified that I am speaking with the correct person using two identifiers. Discussed confidentiality: Yes   I discussed the limitations of telemedicine and the availability of in person appointments.  Discussed there is a possibility of technology failure and discussed alternative modes of communication if that failure occurs.  I discussed that engaging in this telemedicine visit, they consent to the provision of behavioral healthcare and the services will be billed under their insurance.  Patient and/or legal guardian expressed understanding and consented to Telemedicine visit: Yes   Presenting Concerns: Patient and/or family reports the following symptoms/concerns: Anxiety/social anxiety increasing stuttering and interfering with ability to fall asleep; feeling fidgety and nervous.  Duration of problem: Ongoing with recent increase; Severity of problem: moderate  Patient and/or Family's Strengths/Protective Factors: Social connections, Concrete supports in place (healthy food, safe environments, etc.), and Sense of purpose  Goals Addressed: Patient will:  Reduce symptoms of: anxiety   Demonstrate ability to: Increase healthy adjustment to current life  circumstances  Progress towards Goals: Ongoing  Interventions: Interventions utilized:  Supportive Reflection Standardized Assessments completed: GAD-7 and PHQ 9  Patient and/or Family Response: Patient agrees with treatment plan.   Assessment: Patient currently experiencing Anxiety disorder, unspecified.   Patient may benefit from psychoeducation and brief therapeutic interventions regarding coping with symptoms of anxiety .  Plan: Follow up with behavioral health clinician on : Two weeks Behavioral recommendations:  -Continue  using breathing exercises as needed  -Cut back on caffeine sodas at work; notice if any sleep improvement at night  Referral(s): Integrated [employer] (In Clinic)  I discussed the assessment and treatment plan with the patient and/or parent/guardian. They were provided an opportunity to ask questions and all were answered. They agreed with the plan and demonstrated an understanding of the instructions.   They were advised to call back or seek an in-person evaluation if the symptoms worsen or if the condition fails to improve as anticipated.  Rae Lips, LCSW     02/09/2022   10:52 AM 01/16/2022   10:43 AM 09/18/2021    8:34 AM 09/10/2021    2:46 PM 07/21/2021    9:57 AM  Depression screen PHQ 2/9  Decreased Interest 1 0 0 0 0  Down, Depressed, Hopeless 1 1 0 0 0  PHQ - 2 Score 2 1 0 0 0  Altered sleeping 3 0 0 0 1  Tired, decreased energy 2 2 1  0 1  Change in appetite 1 0 1 0 0  Feeling bad or failure about yourself  2 1 0 0 0  Trouble concentrating 0 0 0 0 0  Moving slowly or fidgety/restless 0 1 0 0 0  Suicidal thoughts 0 0 0 0 0  PHQ-9 Score 10 5 2  0 2      02/09/2022   10:55 AM 01/16/2022  10:46 AM 09/18/2021    8:34 AM 09/10/2021    2:45 PM  GAD 7 : Generalized Anxiety Score  Nervous, Anxious, on Edge 3 3 0 0  Control/stop worrying 2 1 0 0  Worry too much - different things 3 2 1 1   Trouble relaxing 1 1 0 0   Restless 0 0 0 0  Easily annoyed or irritable 2 2 1  0  Afraid - awful might happen 0 0 0 0  Total GAD 7 Score 11 9 2  1

## 2022-02-09 ENCOUNTER — Ambulatory Visit (INDEPENDENT_AMBULATORY_CARE_PROVIDER_SITE_OTHER): Payer: Medicaid Other | Admitting: Clinical

## 2022-02-09 DIAGNOSIS — F419 Anxiety disorder, unspecified: Secondary | ICD-10-CM | POA: Diagnosis not present

## 2022-02-11 NOTE — BH Specialist Note (Unsigned)
Pt did not arrive to video visit and did not answer the phone; Left HIPPA-compliant message to call back Emelin Dascenzo from [facility] at [facility] at  [phone] (Micheal Sheen's office).  ?; left MyChart message for patient.  ? ?

## 2022-02-25 ENCOUNTER — Ambulatory Visit: Payer: Medicaid Other | Admitting: Clinical

## 2022-02-25 DIAGNOSIS — Z91199 Patient's noncompliance with other medical treatment and regimen due to unspecified reason: Secondary | ICD-10-CM

## 2022-02-26 ENCOUNTER — Telehealth: Payer: Medicaid Other | Admitting: Physician Assistant

## 2022-02-26 DIAGNOSIS — N9419 Other specified dyspareunia: Secondary | ICD-10-CM

## 2022-02-26 DIAGNOSIS — N920 Excessive and frequent menstruation with regular cycle: Secondary | ICD-10-CM

## 2022-02-26 DIAGNOSIS — N898 Other specified noninflammatory disorders of vagina: Secondary | ICD-10-CM

## 2022-02-26 NOTE — Progress Notes (Signed)
Because of pelvic pain and painful intercourse along with yellow/green discharge that is not common with yeast or normal vaginal bacterial overgrowth, I feel your condition warrants further evaluation and I recommend that you be seen in a face to face visit.   NOTE: There will be NO CHARGE for this eVisit   If you are having a true medical emergency please call 911.      For an urgent face to face visit, [place] has seven urgent care centers for your convenience:     [facility] Center at [facility] Directions [phone] [address] [place], [place] [address]    [facility] Health Urgent Care Center [facility]) Get Driving Directions [phone] [address] [place], [place] [address]  [facility]gent Care Center [facility]) Get Driving Directions [phone] [address] [place],  [place]  [address]  [facility] Health Urgent Care Center [facility] - at [employer] Directions  [phone] [address] [place],  [place] [address]   [facility] Health Urgent Care at [facility] Get Driving Directions [phone] [address] [place], [place] [address]   [facility] Health Urgent Care at [facility] Get Driving Directions  [phone] [address] [place], [place] [address]   [facility] Health Urgent Care at [facility] Directions [phone] [address] [place], [place] [address]  Your MyChart E-visit questionnaire answers were reviewed by a board certified advanced clinical practitioner to complete your personal care plan based on your specific symptoms.  Thank you for using e-Visits.

## 2022-07-29 ENCOUNTER — Emergency Department (HOSPITAL_COMMUNITY)
Admission: EM | Admit: 2022-07-29 | Discharge: 2022-07-30 | Disposition: A | Discharge status: Home / Self Care | Payer: Medicaid Other | Attending: Emergency Medicine | Admitting: Emergency Medicine

## 2022-07-29 ENCOUNTER — Other Ambulatory Visit: Payer: Self-pay

## 2022-07-29 DIAGNOSIS — R197 Diarrhea, unspecified: Secondary | ICD-10-CM | POA: Diagnosis not present

## 2022-07-29 DIAGNOSIS — R1010 Upper abdominal pain, unspecified: Secondary | ICD-10-CM | POA: Diagnosis present

## 2022-07-29 DIAGNOSIS — R1013 Epigastric pain: Secondary | ICD-10-CM | POA: Diagnosis not present

## 2022-07-29 DIAGNOSIS — R110 Nausea: Secondary | ICD-10-CM | POA: Insufficient documentation

## 2022-07-29 LAB — COMPREHENSIVE METABOLIC PANEL
ALT: 18 U/L (ref 0–44)
AST: 22 U/L (ref 15–41)
Albumin: 4.3 g/dL (ref 3.5–5.0)
Alkaline Phosphatase: 74 U/L (ref 38–126)
Anion gap: 12 (ref 5–15)
BUN: 8 mg/dL (ref 6–20)
CO2: 22 mmol/L (ref 22–32)
Calcium: 9 mg/dL (ref 8.9–10.3)
Chloride: 101 mmol/L (ref 98–111)
Creatinine, Ser: 0.58 mg/dL (ref 0.44–1.00)
GFR, Estimated: >60 mL/min (ref >60)
Glucose, Bld: 98 mg/dL (ref 70–99)
Potassium: 4.1 mmol/L (ref 3.5–5.1)
Sodium: 135 mmol/L (ref 135–145)
Total Bilirubin: 0.5 mg/dL (ref 0.3–1.2)
Total Protein: 7.7 g/dL (ref 6.5–8.1)

## 2022-07-29 LAB — URINALYSIS, ROUTINE W REFLEX MICROSCOPIC
Bacteria, UA: NONE SEEN
Bilirubin Urine: NEGATIVE
Glucose, UA: NEGATIVE mg/dL
Hgb urine dipstick: NEGATIVE
Ketones, ur: NEGATIVE mg/dL
Nitrite: NEGATIVE
Protein, ur: NEGATIVE mg/dL
Specific Gravity, Urine: 1.017 (ref 1.005–1.030)
pH: 5 (ref 5.0–8.0)

## 2022-07-29 LAB — CBC WITH DIFFERENTIAL/PLATELET
Abs Immature Granulocytes: 0.02 10*3/uL (ref 0.00–0.07)
Basophils Absolute: 0 10*3/uL (ref 0.0–0.1)
Basophils Relative: 0 %
Eosinophils Absolute: 0 10*3/uL (ref 0.0–0.5)
Eosinophils Relative: 0 %
HCT: 39.1 % (ref 36.0–46.0)
Hemoglobin: 13.1 g/dL (ref 12.0–15.0)
Immature Granulocytes: 0 %
Lymphocytes Relative: 19 %
Lymphs Abs: 1.8 10*3/uL (ref 0.7–4.0)
MCH: 29.3 pg (ref 26.0–34.0)
MCHC: 33.5 g/dL (ref 30.0–36.0)
MCV: 87.5 fL (ref 80.0–100.0)
Monocytes Absolute: 0.6 10*3/uL (ref 0.1–1.0)
Monocytes Relative: 6 %
Neutro Abs: 7.1 10*3/uL (ref 1.7–7.7)
Neutrophils Relative %: 75 %
Platelets: 159 10*3/uL (ref 150–400)
RBC: 4.47 MIL/uL (ref 3.87–5.11)
RDW: 13.4 % (ref 11.5–15.5)
WBC: 9.6 10*3/uL (ref 4.0–10.5)
nRBC: 0 % (ref 0.0–0.2)

## 2022-07-29 LAB — LIPASE, BLOOD: Lipase: 30 U/L (ref 11–51)
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
## 2022-07-29 NOTE — ED Triage Notes (Signed)
Pt c/o intermittent mid upper abdominal pain over the past two days. Abdominal pain returns today associated nausea, diarrhea, and decrease appetite.

## 2022-07-30 ENCOUNTER — Emergency Department (HOSPITAL_COMMUNITY): Payer: Medicaid Other

## 2022-07-30 MED ORDER — ONDANSETRON 4 MG PO TBDP: ORAL_TABLET | ORAL | 0 refills | Status: AC

## 2022-07-30 MED ORDER — PANTOPRAZOLE SODIUM 40 MG PO TBEC: 40.0000 mg | DELAYED_RELEASE_TABLET | Freq: Every day | ORAL | 3 refills | Status: AC

## 2022-07-30 MED ORDER — LOPERAMIDE HCL 2 MG PO CAPS: 2.0000 mg | ORAL_CAPSULE | Freq: Four times a day (QID) | ORAL | 0 refills | Status: AC | PRN

## 2022-07-30 NOTE — Discharge Instructions (Signed)
Please take the medications I have prescribed to see if it helps her symptoms.  Schedule follow-up with your primary doctor.  If symptoms do not improve with this treatment, you will need a referral to a gastroenterologist.

## 2022-07-30 NOTE — ED Provider Notes (Signed)
[facility] Provider Note   CSN: [id number] Arrival date & time: 07/29/22  2049     History  Chief Complaint  Patient presents with   Abdominal Pain    Meagan Martin is a 22 y.o. female.  Patient presents to the emergency department for evaluation of upper abdominal pain.  Patient reports that symptoms have been intermittent for more than a month.  Patient reports that she has nausea and diarrhea with these episodes.  Episodes do sometimes seem to be related to meals, but are also somewhat unpredictable.  No associated fever.       Home Medications Prior to Admission medications   Medication Sig Start Date End Date Taking? Authorizing Provider  acetaminophen (TYLENOL) 325 MG tablet Take 2 tablets (650 mg total) by mouth every 4 (four) hours as needed (for pain scale < 4). 09/22/21   Gabriel Carina, CNM  Blood Pressure Monitoring (BLOOD PRESSURE KIT) DEVI 1 Device by Other route as needed. 02/26/21   Aletha Halim, MD  ibuprofen (ADVIL) 600 MG tablet Take 1 tablet (600 mg total) by mouth every 6 (six) hours. 09/22/21   Gabriel Carina, CNM  loperamide (IMODIUM) 2 MG capsule Take 1 capsule (2 mg total) by mouth 4 (four) times daily as needed for diarrhea or loose stools. 07/30/22  Yes Lucie Friedlander, Gwenyth Allegra, MD  Misc. Devices (GOJJI WEIGHT SCALE) MISC 1 Device by Does not apply route as needed. Patient not taking: Reported on 03/10/2021 02/26/21   Aletha Halim, MD  ondansetron (ZOFRAN-ODT) 4 MG disintegrating tablet 4mg  ODT q4 hours prn nausea/vomit 07/30/22  Yes Tzvi Economou, Gwenyth Allegra, MD  pantoprazole (PROTONIX) 40 MG tablet Take 1 tablet (40 mg total) by mouth daily. 07/30/22  Yes Jaleiyah Alas, Gwenyth Allegra, MD  Prenatal Vit-Fe Fumarate-FA (PREPLUS) 27-1 MG TABS Take 1 tablet by mouth daily.    [provider]      Allergies    Patient has no known allergies.    Review of Systems   Review of Systems  Physical  Exam Updated Vital Signs BP 110/63   Pulse 80   Temp 98 F (36.7 C)   Resp 18   SpO2 100%  Physical Exam Vitals and nursing note reviewed.  Constitutional:      General: She is not in acute distress.    Appearance: She is well-developed.  HENT:     Head: Normocephalic and atraumatic.     Mouth/Throat:     Mouth: Mucous membranes are moist.  Eyes:     General: Vision grossly intact. Gaze aligned appropriately.     Extraocular Movements: Extraocular movements intact.     Conjunctiva/sclera: Conjunctivae normal.  Cardiovascular:     Rate and Rhythm: Normal rate and regular rhythm.     Pulses: Normal pulses.     Heart sounds: Normal heart sounds, S1 normal and S2 normal. No murmur heard.    No friction rub. No gallop.  Pulmonary:     Effort: Pulmonary effort is normal. No respiratory distress.     Breath sounds: Normal breath sounds.  Abdominal:     General: Bowel sounds are normal.     Palpations: Abdomen is soft.     Tenderness: There is abdominal tenderness in the epigastric area. There is no guarding or rebound.     Hernia: No hernia is present.  Musculoskeletal:        General: No swelling.     Cervical back: Full passive range of  motion without pain, normal range of motion and neck supple. No spinous process tenderness or muscular tenderness. Normal range of motion.     Right lower leg: No edema.     Left lower leg: No edema.  Skin:    General: Skin is warm and dry.     Capillary Refill: Capillary refill takes less than 2 seconds.     Findings: No ecchymosis, erythema, rash or wound.  Neurological:     General: No focal deficit present.     Mental Status: She is alert and oriented to person, place, and time.     GCS: GCS eye subscore is 4. GCS verbal subscore is 5. GCS motor subscore is 6.     Cranial Nerves: Cranial nerves 2-12 are intact.     Sensory: Sensation is intact.     Motor: Motor function is intact.     Coordination: Coordination is intact.   Psychiatric:        Attention and Perception: Attention normal.        Mood and Affect: Mood normal.        Speech: Speech normal.        Behavior: Behavior normal.     ED Results / Procedures / Treatments   Labs (all labs ordered are listed, but only abnormal results are displayed) Labs Reviewed  URINALYSIS, ROUTINE W REFLEX MICROSCOPIC - Abnormal; Notable for the following components:      Result Value   APPearance HAZY (*)    Leukocytes,Ua TRACE (*)    All other components within normal limits  CBC WITH DIFFERENTIAL/PLATELET  COMPREHENSIVE METABOLIC PANEL  LIPASE, BLOOD    EKG None  Radiology US ABDOMEN LIMITED RUQ (LIVER/GB)  Result Date: 07/30/2022 CLINICAL DATA:  Upper abdominal pain. EXAM: ULTRASOUND ABDOMEN LIMITED RIGHT UPPER QUADRANT COMPARISON:  None Available. FINDINGS: Gallbladder: No gallstones or wall thickening visualized (2.2 mm). No sonographic Murphy sign noted by sonographer. Common bile duct: Diameter: 2.1 mm Liver: No focal lesion identified. Within normal limits in parenchymal echogenicity. Portal vein is patent on color Doppler imaging with normal direction of blood flow towards the liver. Other: None. IMPRESSION: Normal right upper quadrant ultrasound. Electronically Signed   By: Virgina Norfolk M.D.   On: 07/30/2022 01:53    Procedures Procedures    Medications Ordered in ED Medications - No data to display  ED Course/ Medical Decision Making/ A&P                             Medical Decision Making Amount and/or Complexity of Data Reviewed Radiology: ordered.   Patient presents with intermittent abdominal pain ongoing for months.  Pain worse today.  Pain associated with nausea, no vomiting but with diarrhea.  Sometimes symptoms are related to eating.  Patient's lab work is unremarkable.  She has not had any prior workups for this problem.  Patient underwent ultrasound to evaluate gallbladder.  No evidence of gallbladder disease.   Examination reveals primarily epigastric rather than right upper quadrant pain and tenderness.  Treat symptomatically for gastritis, follow-up with PCP.  If symptoms continue will benefit from GI consultation.        Final Clinical Impression(s) / ED Diagnoses Final diagnoses:  Epigastric pain    Rx / DC Orders ED Discharge Orders          Ordered    loperamide (IMODIUM) 2 MG capsule  4 times daily PRN  07/30/22 0203    ondansetron (ZOFRAN-ODT) 4 MG disintegrating tablet        07/30/22 0203    pantoprazole (PROTONIX) 40 MG tablet  Daily        07/30/22 0203              Orpah Greek, MD 07/30/22 [phone]

## 2023-01-20 IMAGING — US US OB COMP LESS 14 WK
1 series · 15 of 28 positions shown · non-contrast
Comparison: None for this pregnancy

CLINICAL DATA: Cramping for 3 days in first trimester of pregnancy,
pending

EXAM:
OBSTETRIC <14 WK ULTRASOUND
TECHNIQUE: Transabdominal ultrasound was performed for evaluation of the
gestation as well as the maternal uterus and adnexal regions.

[Series 1: us ob comp less 14 wk · 41 acquisitions, 15 frames shown]
[im 1/41]
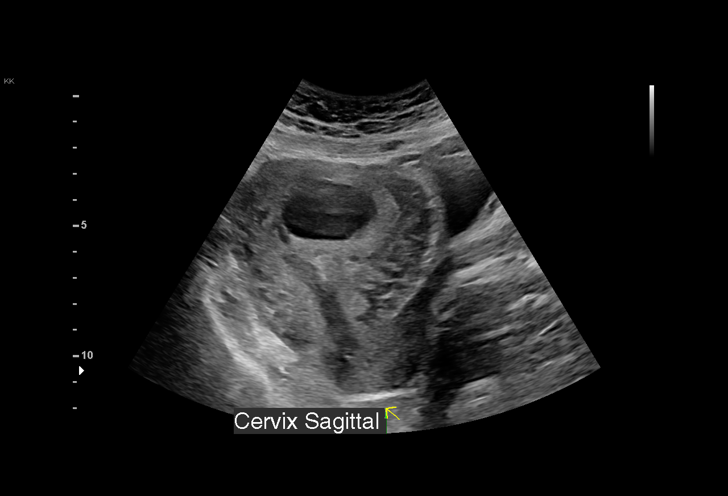
[im 3/41]
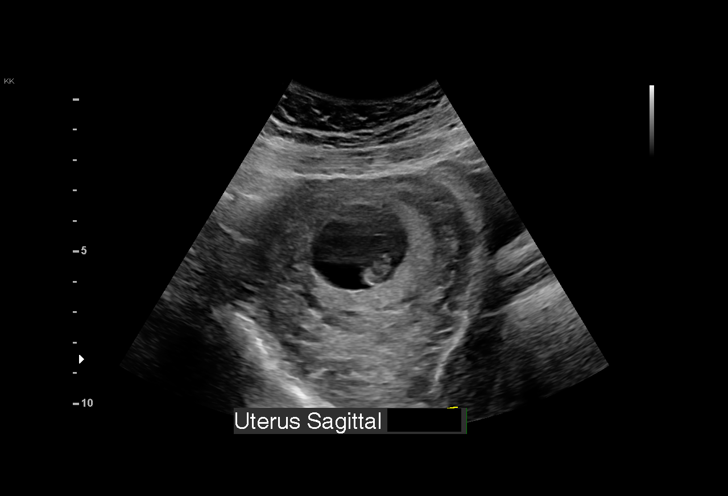
[im 6/41]
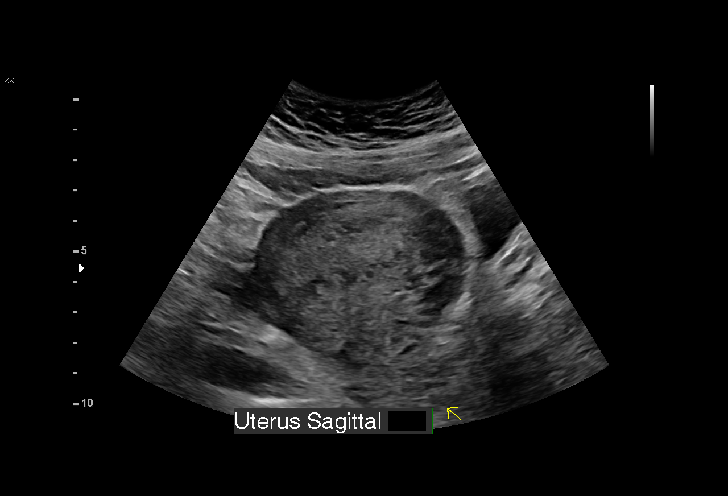
[im 9/41]
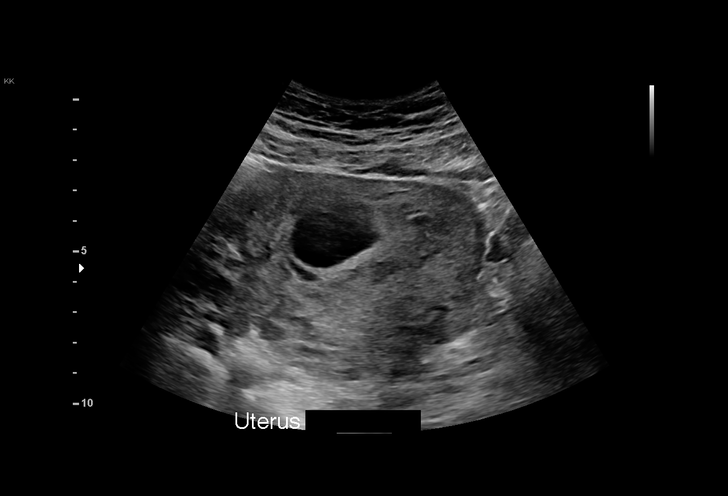
[im 12/41]
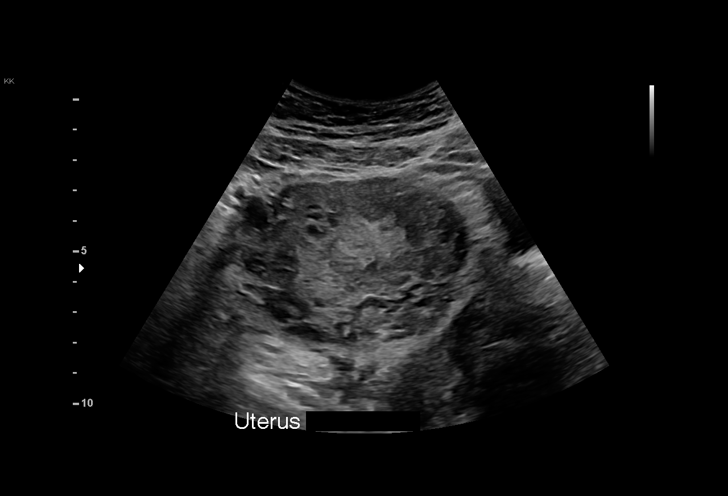
[im 15/41]
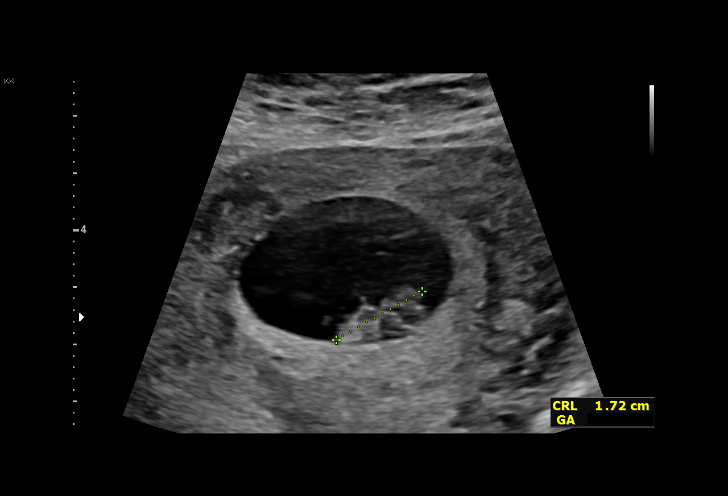
[im 18/41]
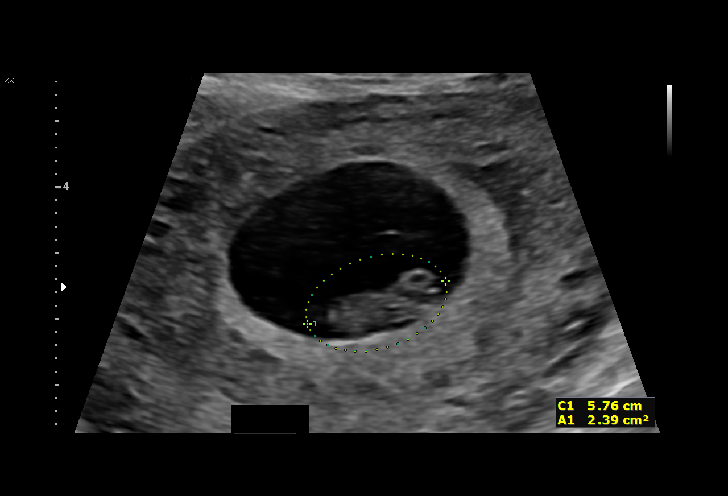
[im 21/41]
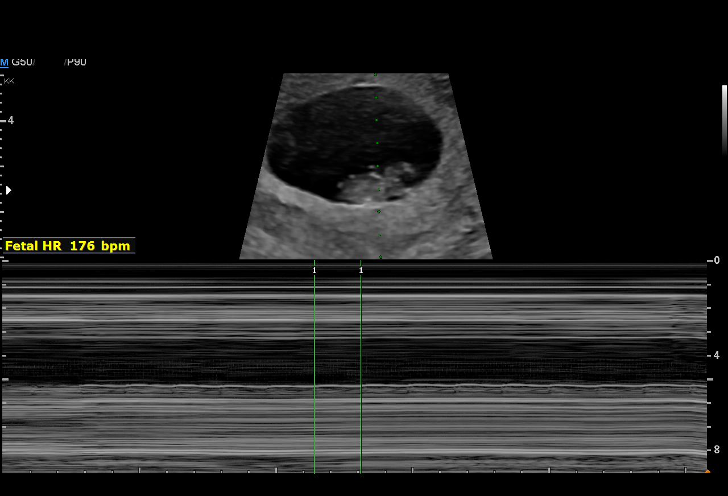
[im 23/41]
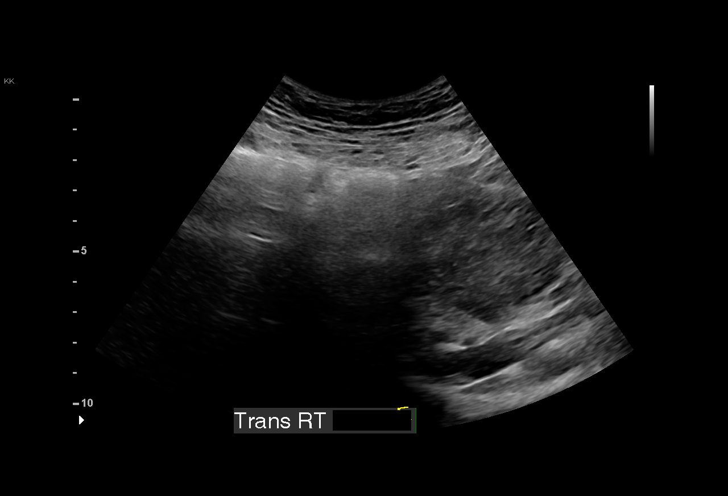
[im 26/41]
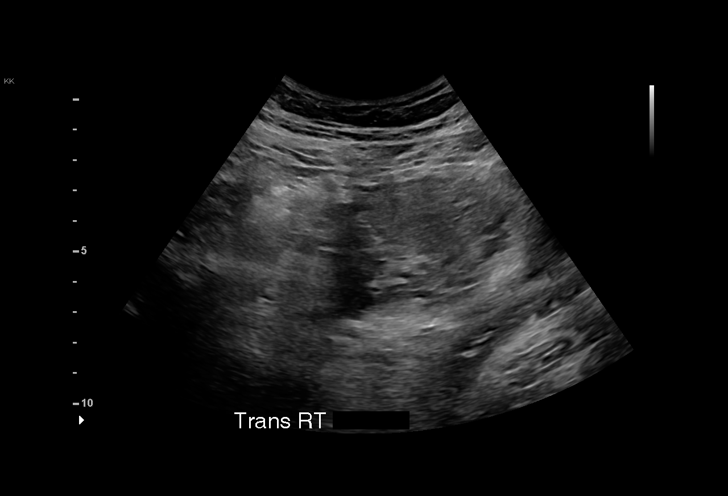
[im 29/41]
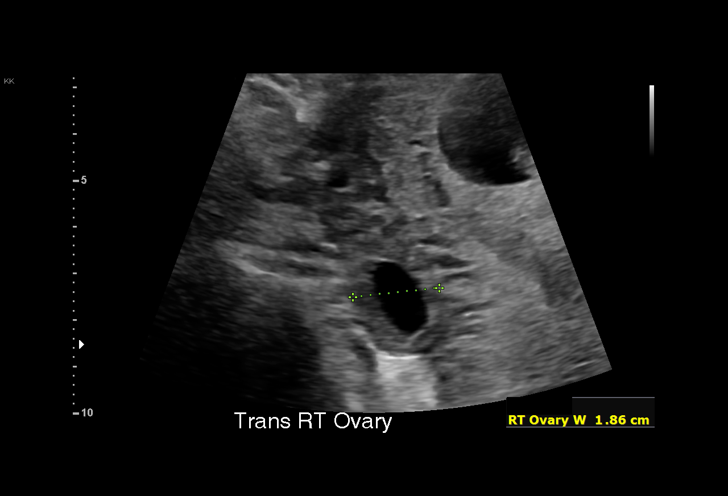
[im 32/41]
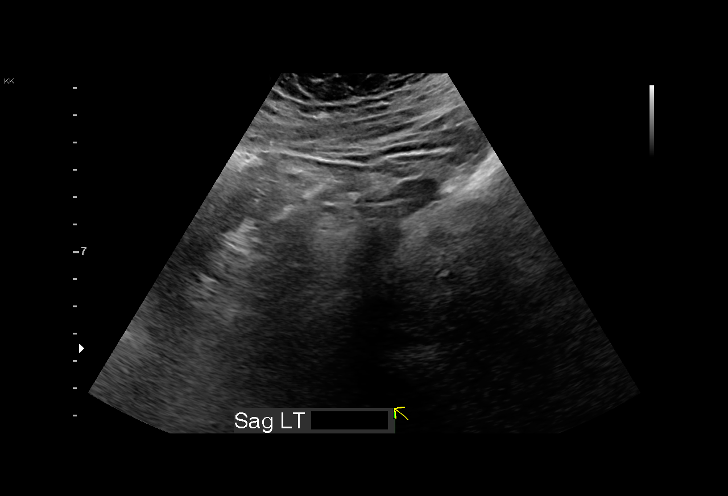
[im 35/41]
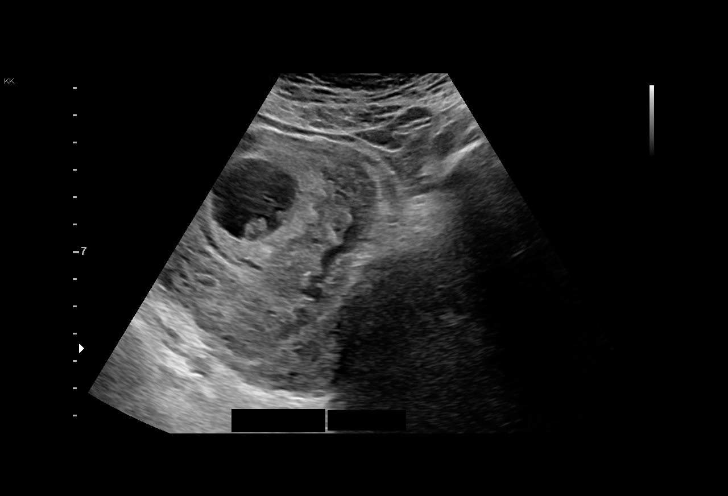
[im 38/41]
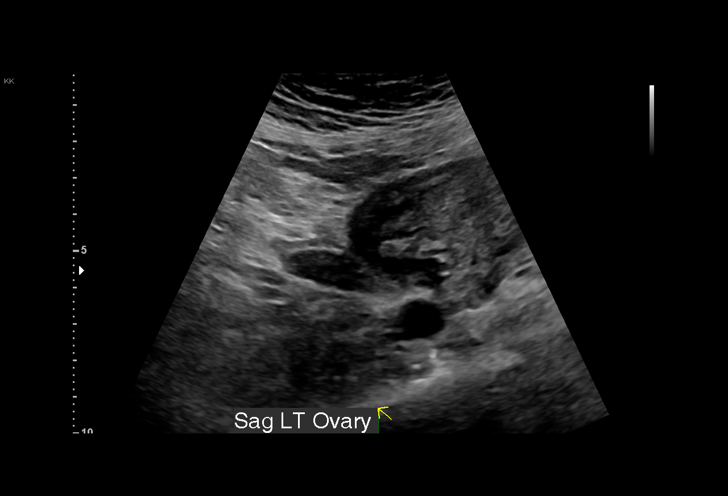
[im 41/41]
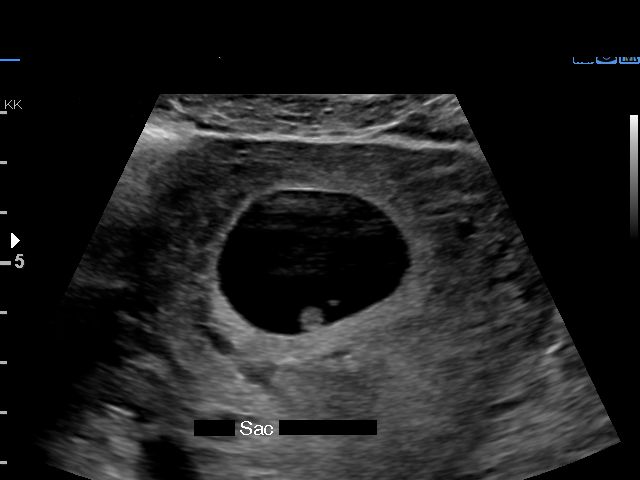

[15 of 28 positions shown; findings below may reference images not displayed]

FINDINGS: Intrauterine gestational sac: Present, single

Yolk sac:  Present

Embryo:  Present

Cardiac Activity: Present

Heart Rate: 176 bpm

CRL:   18.1 mm   8 w 2 d                  US EDC: 10/04/2021

Subchorionic hemorrhage:  None visualized.

Maternal uterus/adnexae:

Uterus anteverted, otherwise unremarkable.

RIGHT ovary measures 4.5 x 2.0 x 1.9 cm and contains a small corpus
luteal cyst.

Patient was tender over the RIGHT ovary with imaging.

LEFT ovary normal size and morphology 1.0 x 2.0 x 3.0 cm.

No free pelvic fluid or adnexal masses.
IMPRESSION: Single live intrauterine gestation at 8 weeks 2 days EGA by
crown-rump length.

## 2023-04-04 IMAGING — US US MFM OB COMP +14 WKS
1 series · 13 of 28 positions shown · non-contrast
Comparison: none

[Series 1: us mfm ob comp +14 wks · 116 acquisitions, 13 frames shown]
[im 5/116]
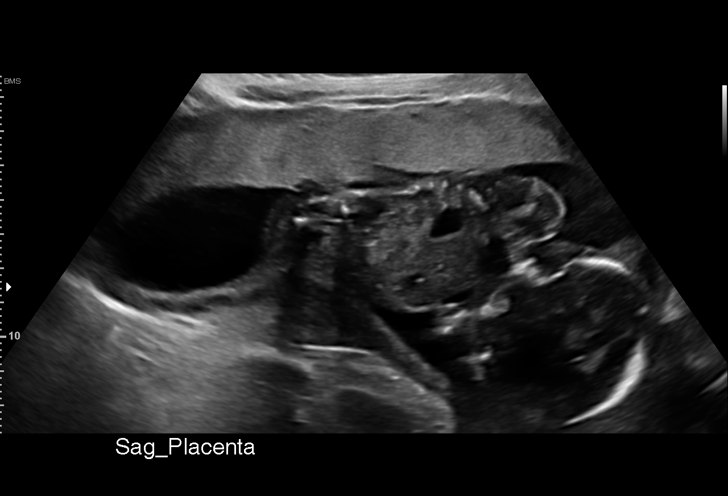
[im 13/116]
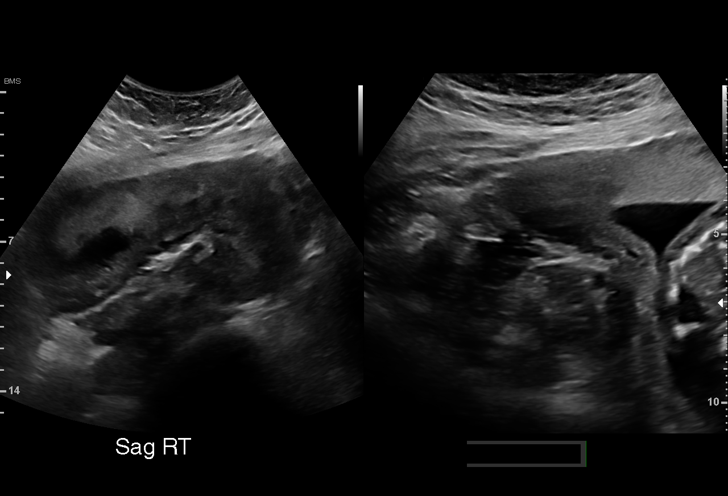
[im 22/116]
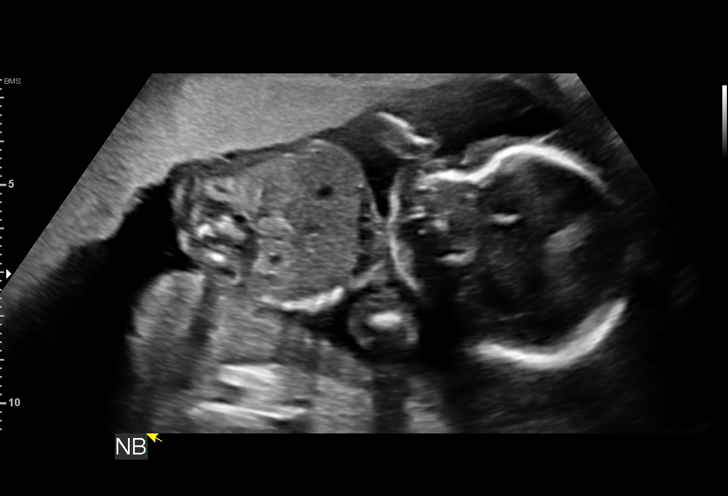
[im 30/116]
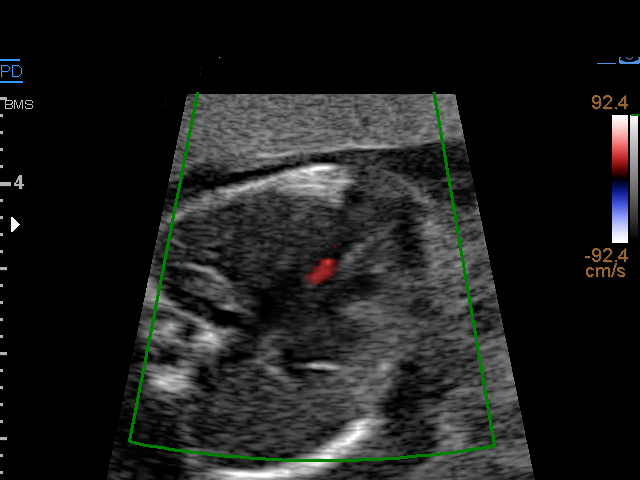
[im 39/116]
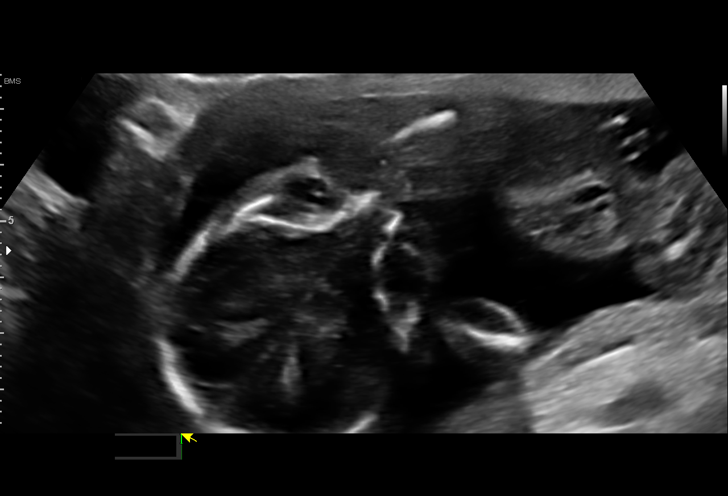
[im 47/116]
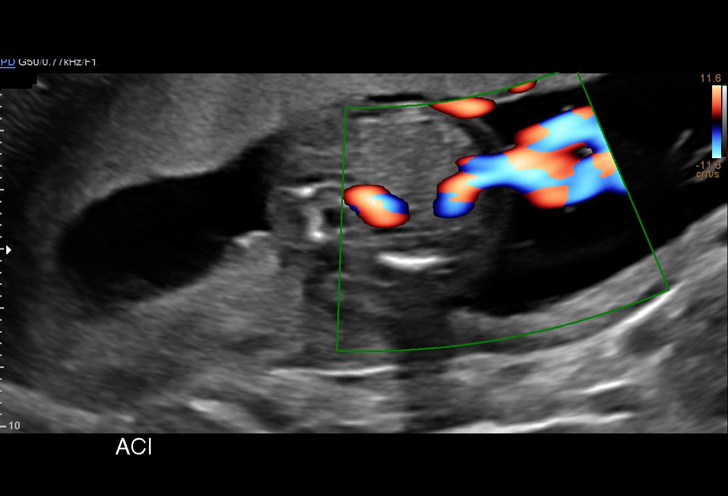
[im 60/116]
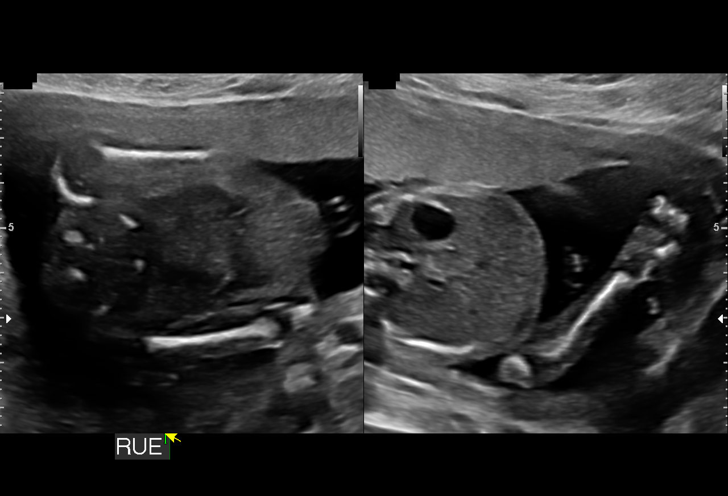
[im 69/116]
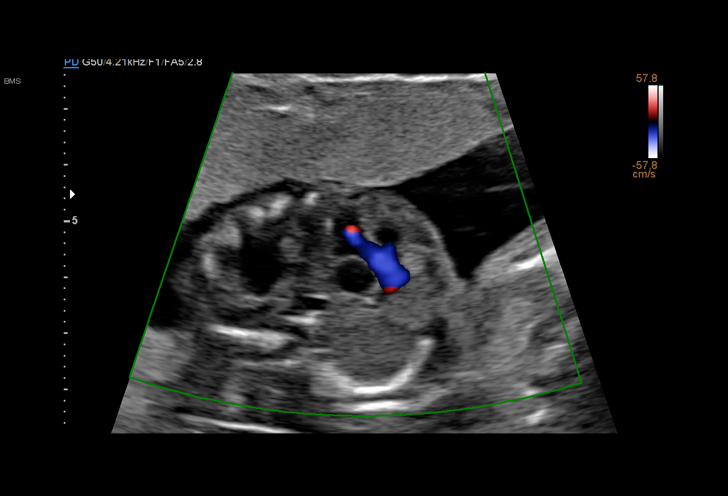
[im 77/116]
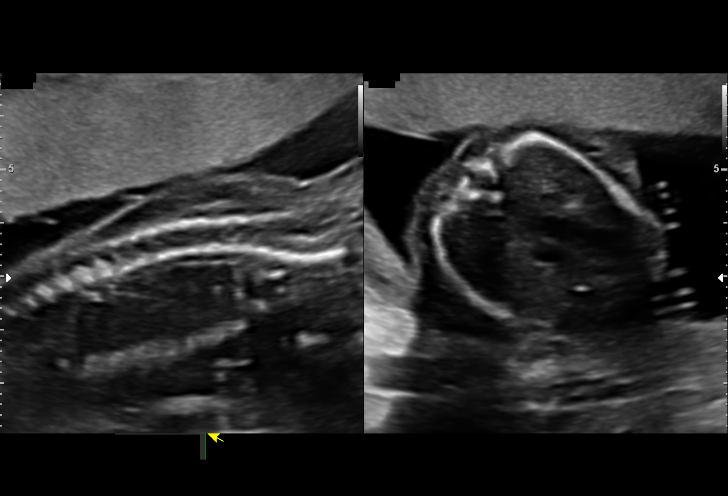
[im 86/116]
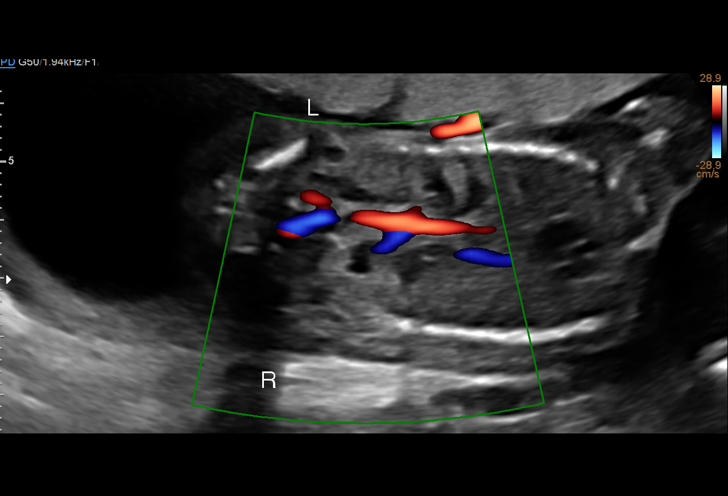
[im 94/116]
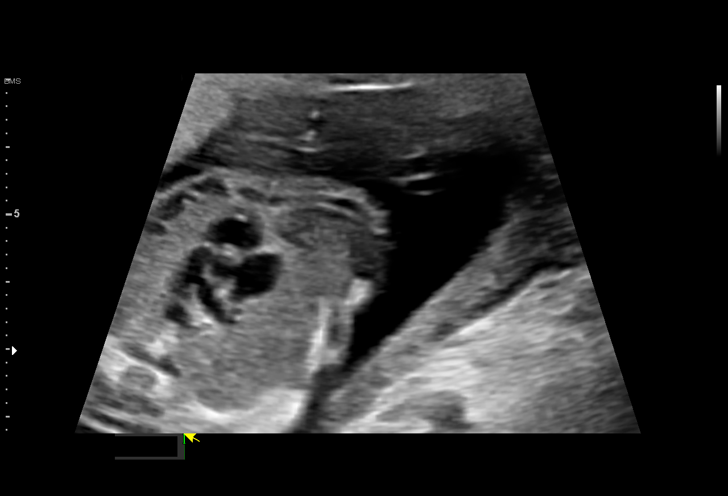
[im 103/116]
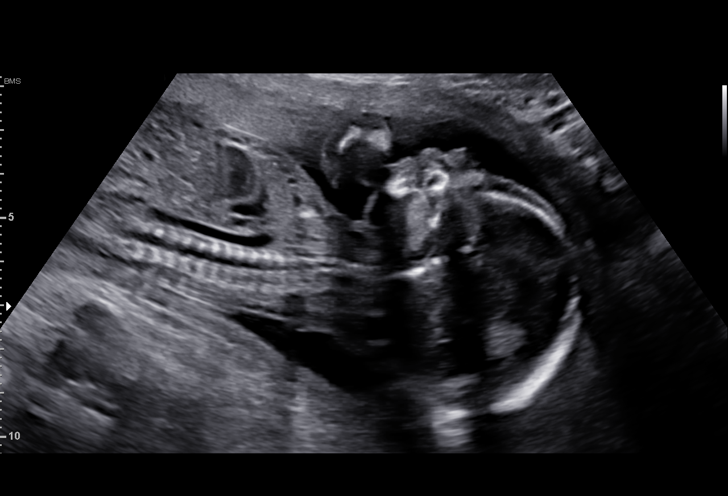
[im 111/116]
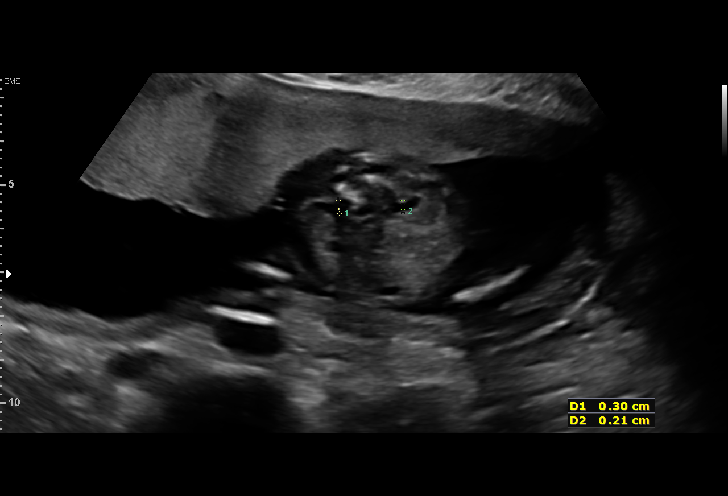

[13 of 28 positions shown; findings below may reference images not displayed]

1  US MFM OB COMP + 14 WK                76805.01    GUNNAR ERLING UCHIHAHARUNO

Indications

 19 weeks gestation of pregnancy
 Encounter for antenatal screening for
 malformations
 LR NIPS, Neg Horizon
 Short interval between pregancies, 2nd
 trimester
Fetal Evaluation

 Num Of Fetuses:         1
 Fetal Heart Rate(bpm):  158
 Cardiac Activity:       Observed
 Presentation:           Cephalic
 Placenta:               Anterior
 P. Cord Insertion:      Visualized, central

 Amniotic Fluid
 AFI FV:      Within normal limits

                             Largest Pocket(cm)

Biometry

 BPD:        47  mm     G. Age:  20w 1d         91  %    CI:        76.22   %    70 - 86
                                                         FL/HC:      16.9   %    16.1 -
 HC:      170.6  mm     G. Age:  19w 5d         74  %    HC/AC:      1.18        1.09 -
 AC:      144.8  mm     G. Age:  19w 6d         72  %    FL/BPD:     61.5   %
 FL:       28.9  mm     G. Age:  18w 6d         39  %    FL/AC:      20.0   %    20 - 24
 HUM:      27.7  mm     G. Age:  18w 6d         47  %
 CER:      21.1  mm     G. Age:  20w 0d         85  %
 NFT:       4.5  mm

 LV:        8.4  mm
 CM:        3.6  mm

 Est. FW:     292  gm    0 lb 10 oz      71  %
Gestational Age

 LMP:           19w 0d        Date:  12/27/20                 EDD:   10/03/21
 U/S Today:     19w 5d                                        EDD:   09/28/21
 Best:          19w 0d     Det. By:  LMP  (12/27/20)          EDD:   10/03/21
Anatomy

 Cranium:               Appears normal         LVOT:                   Appears normal
 Cavum:                 Appears normal         Aortic Arch:            Appears normal
 Ventricles:            Appears normal         Ductal Arch:            Appears normal
 Choroid Plexus:        Appears normal         Diaphragm:              Appears normal
 Cerebellum:            Appears normal         Stomach:                Appears normal, left
                                                                       sided
 Posterior Fossa:       Appears normal         Abdomen:                Appears normal
 Nuchal Fold:           Appears normal         Abdominal Wall:         Appears nml (cord
                                                                       insert, abd wall)
 Face:                  Appears normal         Cord Vessels:           Appears normal (3
                        (orbits and profile)                           vessel cord)
 Lips:                  Appears normal         Kidneys:                Appear normal
 Palate:                Not well visualized    Bladder:                Appears normal
 Thoracic:              Appears normal         Spine:                  Appears normal
 Heart:                 Appears normal         Upper Extremities:      Appears normal
                        (4CH, axis, and
                        situs)
 RVOT:                  Appears normal         Lower Extremities:      Appears normal

 Other:  Heels/feet and open hands/5th digits visualized. 3VV visualized.
         Fetus appears to be a male. Nasal bone and lenses visualized.
Cervix Uterus Adnexa

 Cervix
 Length:           3.32  cm.
 Normal appearance by transabdominal scan.

 Uterus
 No abnormality visualized.

 Right Ovary
 Not visualized.

 Left Ovary
 Within normal limits.

 Cul De Sac
 No free fluid seen.

 Adnexa
 No adnexal mass visualized.
Impression

 G2 P1.  Patient is here for fetal anatomy scan.
 Obstetrical history significant for a term vaginal delivery in
 September 2020 of a female infant weighing 6 pounds 9 ounces at
 birth.
 On cell-free fetal DNA screening, the risks of fetal
 aneuploidies are not increased .

 We performed fetal anatomy scan. No makers of
 aneuploidies or fetal structural defects are seen. Fetal
 biometry is consistent with her previously-established dates.
 Amniotic fluid is normal and good fetal activity is seen.
 Patient understands the limitations of ultrasound in detecting
 fetal anomalies.
                 Nennig, Laquintana
